# Patient Record
Sex: Female | Born: 1990
Health system: Southern US, Community
[De-identification: ages and names within clinical notes are randomized; demographics above are authoritative.]

## PROBLEM LIST (undated history)

## (undated) DIAGNOSIS — D649 Anemia, unspecified: Secondary | ICD-10-CM

## (undated) DIAGNOSIS — M2550 Pain in unspecified joint: Secondary | ICD-10-CM

## (undated) DIAGNOSIS — I471 Supraventricular tachycardia, unspecified: Secondary | ICD-10-CM

## (undated) DIAGNOSIS — E039 Hypothyroidism, unspecified: Secondary | ICD-10-CM

## (undated) DIAGNOSIS — I4719 Other supraventricular tachycardia: Secondary | ICD-10-CM

## (undated) DIAGNOSIS — E0590 Thyrotoxicosis, unspecified without thyrotoxic crisis or storm: Secondary | ICD-10-CM

## (undated) DIAGNOSIS — Z87442 Personal history of urinary calculi: Secondary | ICD-10-CM

## (undated) DIAGNOSIS — R000 Tachycardia, unspecified: Secondary | ICD-10-CM

## (undated) HISTORY — DX: Supraventricular tachycardia: I47.1

## (undated) HISTORY — DX: Supraventricular tachycardia, unspecified: I47.10

## (undated) HISTORY — DX: Pain in unspecified joint: M25.50

## (undated) HISTORY — DX: Other supraventricular tachycardia: I47.19

## (undated) HISTORY — PX: ABLATION: SHX5711

---
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
## 2012-09-05 ENCOUNTER — Other Ambulatory Visit (HOSPITAL_COMMUNITY): Payer: Self-pay | Admitting: Endocrinology

## 2012-09-05 DIAGNOSIS — E0590 Thyrotoxicosis, unspecified without thyrotoxic crisis or storm: Secondary | ICD-10-CM

## 2012-09-07 ENCOUNTER — Encounter (HOSPITAL_COMMUNITY): Payer: Managed Care, Other (non HMO)

## 2012-09-08 ENCOUNTER — Encounter (HOSPITAL_COMMUNITY): Payer: Managed Care, Other (non HMO)

## 2012-09-19 ENCOUNTER — Encounter (HOSPITAL_COMMUNITY): Admission: RE | Admit: 2012-09-19 | Payer: Managed Care, Other (non HMO) | Source: Ambulatory Visit

## 2012-09-20 ENCOUNTER — Encounter (HOSPITAL_COMMUNITY): Payer: Managed Care, Other (non HMO)

## 2012-09-22 ENCOUNTER — Encounter (HOSPITAL_COMMUNITY): Payer: Managed Care, Other (non HMO)

## 2012-09-23 ENCOUNTER — Encounter (HOSPITAL_COMMUNITY): Payer: Managed Care, Other (non HMO)

## 2012-09-28 ENCOUNTER — Encounter (HOSPITAL_COMMUNITY): Payer: Self-pay

## 2012-09-28 ENCOUNTER — Encounter (HOSPITAL_COMMUNITY)
Admission: RE | Admit: 2012-09-28 | Discharge: 2012-09-28 | Disposition: A | Discharge status: Home / Self Care | Payer: Managed Care, Other (non HMO) | Source: Ambulatory Visit | Attending: Endocrinology | Admitting: Endocrinology

## 2012-09-28 DIAGNOSIS — E0590 Thyrotoxicosis, unspecified without thyrotoxic crisis or storm: Secondary | ICD-10-CM | POA: Insufficient documentation

## 2012-09-28 MED ORDER — SODIUM IODIDE I 131 CAPSULE
7.0000 | Freq: Once | INTRAVENOUS | Status: AC | PRN
  Administered 2012-09-28: 7 via ORAL

## 2012-09-29 ENCOUNTER — Encounter (HOSPITAL_COMMUNITY)
Admission: RE | Admit: 2012-09-29 | Discharge: 2012-09-29 | Disposition: A | Discharge status: Home / Self Care | Payer: Managed Care, Other (non HMO) | Source: Ambulatory Visit | Attending: Endocrinology | Admitting: Endocrinology

## 2012-09-29 MED ORDER — SODIUM PERTECHNETATE TC 99M INJECTION
10.0000 | Freq: Once | INTRAVENOUS | Status: AC | PRN
  Administered 2012-09-29: 10 via INTRAVENOUS

## 2013-01-24 ENCOUNTER — Other Ambulatory Visit (HOSPITAL_COMMUNITY): Payer: Self-pay | Admitting: Endocrinology

## 2013-01-24 DIAGNOSIS — E0590 Thyrotoxicosis, unspecified without thyrotoxic crisis or storm: Secondary | ICD-10-CM

## 2013-01-27 ENCOUNTER — Encounter (HOSPITAL_COMMUNITY)
Admission: RE | Admit: 2013-01-27 | Discharge: 2013-01-27 | Disposition: A | Discharge status: Home / Self Care | Payer: Managed Care, Other (non HMO) | Source: Ambulatory Visit | Attending: Endocrinology | Admitting: Endocrinology

## 2013-01-27 ENCOUNTER — Encounter (HOSPITAL_COMMUNITY): Payer: Self-pay

## 2013-01-27 DIAGNOSIS — E0590 Thyrotoxicosis, unspecified without thyrotoxic crisis or storm: Secondary | ICD-10-CM | POA: Insufficient documentation

## 2013-01-27 LAB — HCG, SERUM, QUALITATIVE: Preg, Serum: NEGATIVE

## 2013-01-27 MED ORDER — SODIUM IODIDE I 131 CAPSULE
12.0000 | Freq: Once | INTRAVENOUS | Status: AC | PRN
  Administered 2013-01-27: 12 via ORAL

## 2013-01-29 ENCOUNTER — Emergency Department (HOSPITAL_COMMUNITY)
Admission: EM | Admit: 2013-01-29 | Discharge: 2013-01-29 | Disposition: A | Discharge status: Home / Self Care | Payer: Managed Care, Other (non HMO) | Attending: Emergency Medicine | Admitting: Emergency Medicine

## 2013-01-29 ENCOUNTER — Emergency Department (HOSPITAL_COMMUNITY): Payer: Managed Care, Other (non HMO)

## 2013-01-29 ENCOUNTER — Other Ambulatory Visit: Payer: Self-pay

## 2013-01-29 ENCOUNTER — Encounter (HOSPITAL_COMMUNITY): Payer: Self-pay | Admitting: *Deleted

## 2013-01-29 DIAGNOSIS — R5381 Other malaise: Secondary | ICD-10-CM | POA: Insufficient documentation

## 2013-01-29 DIAGNOSIS — R0789 Other chest pain: Secondary | ICD-10-CM | POA: Insufficient documentation

## 2013-01-29 DIAGNOSIS — E0590 Thyrotoxicosis, unspecified without thyrotoxic crisis or storm: Secondary | ICD-10-CM | POA: Insufficient documentation

## 2013-01-29 DIAGNOSIS — Z8679 Personal history of other diseases of the circulatory system: Secondary | ICD-10-CM | POA: Insufficient documentation

## 2013-01-29 DIAGNOSIS — R079 Chest pain, unspecified: Secondary | ICD-10-CM

## 2013-01-29 HISTORY — DX: Thyrotoxicosis, unspecified without thyrotoxic crisis or storm: E05.90

## 2013-01-29 HISTORY — DX: Tachycardia, unspecified: R00.0

## 2013-01-29 LAB — CBC WITH DIFFERENTIAL/PLATELET
Basophils Absolute: 0 10*3/uL (ref 0.0–0.1)
Eosinophils Relative: 0 % (ref 0–5)
Lymphocytes Relative: 16 % (ref 12–46)
MCV: 85.9 fL (ref 78.0–100.0)
Neutro Abs: 5.4 10*3/uL (ref 1.7–7.7)
Neutrophils Relative %: 81 % — ABNORMAL HIGH (ref 43–77)
Platelets: 296 10*3/uL (ref 150–400)
RBC: 4.47 MIL/uL (ref 3.87–5.11)
RDW: 12.3 % (ref 11.5–15.5)
WBC: 6.6 10*3/uL (ref 4.0–10.5)

## 2013-01-29 LAB — COMPREHENSIVE METABOLIC PANEL
ALT: 13 U/L (ref 0–35)
AST: 12 U/L (ref 0–37)
Alkaline Phosphatase: 70 U/L (ref 39–117)
CO2: 28 mEq/L (ref 19–32)
Calcium: 9.4 mg/dL (ref 8.4–10.5)
GFR calc non Af Amer: >90 mL/min (ref >90)
Potassium: 4.3 mEq/L (ref 3.5–5.1)
Sodium: 141 mEq/L (ref 135–145)

## 2013-01-29 NOTE — ED Notes (Signed)
Pt states that she had radiation performed for her thyroid on 01/27/2013 at [facility] radiology, started having left side chest pain that remains constant last night, pain is associated with light headedness, sob, states that she feels like her heart is beating fast when she lays down

## 2013-01-29 NOTE — ED Provider Notes (Signed)
History  This chart was scribed for Andrea Hutching, MD by Manuela Schwartz, ED scribe. This patient was seen in room APA07/APA07 and the patient's care was started at 1746.   CSN: [id number]  Arrival date & time 01/29/13  1746   First MD Initiated Contact with Patient 01/29/13 1809      Chief Complaint  Patient presents with  . Chest Pain   Patient is a 22 y.o. female presenting with chest pain. The history is provided by the patient. No language interpreter was used.  Chest Pain Pain location:  L chest Pain quality: pressure   Pain radiates to:  Does not radiate Pain radiates to the back: no   Pain severity:  Mild Onset quality:  Sudden Duration:  12 hours Timing:  Constant Progression:  Unchanged Chronicity:  New Context: at rest   Relieved by:  Nothing Worsened by:  Nothing tried Ineffective treatments:  None tried Associated symptoms: no fever, no nausea, no shortness of breath, not vomiting and no weakness    HPI Comments: Andrea Rangel is a 22 y.o. female who presents to the Emergency Department complaining of constant mild to moderate left sided chest heaviness/pain onset 930 last PM with associated fatigue. She had radiation performed for her thyroid on 01/27/2013 at [facility] radiology by x1 tablet of Iodine 131, treating for hyperthyroidism. She denies nausea, SOB. She had an ablation x2 years ago for SVT and currently on atenolol.     Past Medical History  Diagnosis Date  . Hyperthyroidism   . Tachycardia     Past Surgical History  Procedure Laterality Date  . Ablation      No family history on file.  History  Substance Use Topics  . Smoking status: Never Smoker   . Smokeless tobacco: Not on file  . Alcohol Use: No    OB History   Grav Para Term Preterm Abortions TAB SAB Ect Mult Living                  Review of Systems  Constitutional: Negative for fever and chills.  Respiratory: Negative for shortness of breath.   Cardiovascular: Positive  for chest pain.  Gastrointestinal: Negative for nausea and vomiting.  Neurological: Negative for weakness.  All other systems reviewed and are negative.   A complete 10 system review of systems was obtained and all systems are negative except as noted in the HPI and PMH.   Allergies  Review of patient's allergies indicates not on file.  Home Medications  No current outpatient prescriptions on file.  Triage Vitals: BP 113/61  Pulse 57  Temp(Src) 98 F (36.7 C)  Resp 16  Ht 5\' 4"  (1.626 m)  Wt 130 lb (58.968 kg)  BMI 22.3 kg/m2  SpO2 100%  LMP 01/27/2013  Physical Exam  Nursing note and vitals reviewed. Constitutional: She is oriented to person, place, and time. She appears well-developed and well-nourished.  HENT:  Head: Normocephalic and atraumatic.  Eyes: Conjunctivae and EOM are normal. Pupils are equal, round, and reactive to light.  Neck: Normal range of motion. Neck supple.  Cardiovascular: Normal rate, regular rhythm and normal heart sounds.   Pulmonary/Chest: Effort normal and breath sounds normal.  Abdominal: Soft. Bowel sounds are normal.  Musculoskeletal: Normal range of motion.  Neurological: She is alert and oriented to person, place, and time.  Skin: Skin is warm and dry.  Psychiatric: She has a normal mood and affect.    ED Course  Procedures (including  critical care time) DIAGNOSTIC STUDIES: Oxygen Saturation is 100% on room air, normal by my interpretation.    COORDINATION OF CARE: At 625 PM Discussed treatment plan with patient which includes CXR, blood work, D-dimer. Patient agrees.   Labs Reviewed  CBC WITH DIFFERENTIAL - Abnormal; Notable for the following:    Neutrophils Relative % 81 (*)    All other components within normal limits  COMPREHENSIVE METABOLIC PANEL - Abnormal; Notable for the following:    Glucose, Bld 117 (*)    Total Bilirubin 0.2 (*)    All other components within normal limits  TROPONIN I  D-DIMER, QUANTITATIVE   No  results found. Results for orders placed during the hospital encounter of 01/27/13  HCG, SERUM, QUALITATIVE      Result Value Range   Preg, Serum NEGATIVE  NEGATIVE   Nm Rai Therapy For Hyperthyroidism  01/27/2013   *RADIOLOGY REPORT*  Clinical Data: Hyperthyroidism  NUCLEAR MEDICINE RADIOACTIVE IODINE THERAPY FOR HYPERTHYROIDISM  Technique:  The risks and benefits of radioactive iodine therapy were discussed with the patient in detail. Alternative therapies were also mentioned. Radiation safety was discussed with the patient, including how to protect the general public from exposure. There were no barriers to communication.  Written consent was obtained.  The patient then received a capsule containing the radiopharmaceutical.  The patient will follow-up with the referring physician.  Radiopharmaceutical: CURIE I-131 SODIUM IODIDE I 131 CAPSULE  Comparison: 12 mCi I-131 sodium iodide.  IMPRESSION: Per oral administration of radiolabeled iodine for the treatment of hyperthyroidism.   Original Report Authenticated By: Signa Kell, M.D.   Dg Chest Port 1 View  01/29/2013   *RADIOLOGY REPORT*  Clinical Data: Chest pain.  PORTABLE CHEST - 1 VIEW  Comparison: None.  Findings: Heart size and pulmonary vascularity are normal and the lungs are clear.  No osseous abnormality.  IMPRESSION: Normal exam.   Original Report Authenticated By: Francene Boyers, M.D.     No diagnosis found.   Date: 01/29/2013  Rate: 57  Rhythm: sinus bradycardia  QRS Axis: normal  Intervals: normal  ST/T Wave abnormalities: normal  Conduction Disutrbances:right bundle branch block  Narrative Interpretation:   Old EKG Reviewed: changes noted   MDM   Patient is low risk for acute coronary syndrome or pulmonary embolism. Screening tests including EKG, chest x-ray, troponin, d-dimer all negative.  She is in no acute distress. Vital signs are normal    I personally performed the services described in this  documentation, which was scribed in my presence. The recorded information has been reviewed and is accurate.          Andrea Hutching, MD 01/29/13 [phone]

## 2013-05-15 ENCOUNTER — Other Ambulatory Visit: Payer: Self-pay

## 2013-05-15 ENCOUNTER — Emergency Department (HOSPITAL_COMMUNITY)
Admission: EM | Admit: 2013-05-15 | Discharge: 2013-05-15 | Disposition: A | Discharge status: Home / Self Care | Payer: BC Managed Care – PPO | Attending: Emergency Medicine | Admitting: Emergency Medicine

## 2013-05-15 ENCOUNTER — Encounter (HOSPITAL_COMMUNITY): Payer: Self-pay

## 2013-05-15 ENCOUNTER — Emergency Department (HOSPITAL_COMMUNITY): Payer: BC Managed Care – PPO

## 2013-05-15 DIAGNOSIS — R110 Nausea: Secondary | ICD-10-CM | POA: Insufficient documentation

## 2013-05-15 DIAGNOSIS — E0590 Thyrotoxicosis, unspecified without thyrotoxic crisis or storm: Secondary | ICD-10-CM | POA: Insufficient documentation

## 2013-05-15 DIAGNOSIS — R0602 Shortness of breath: Secondary | ICD-10-CM | POA: Insufficient documentation

## 2013-05-15 DIAGNOSIS — Z79899 Other long term (current) drug therapy: Secondary | ICD-10-CM | POA: Insufficient documentation

## 2013-05-15 DIAGNOSIS — R0789 Other chest pain: Secondary | ICD-10-CM | POA: Insufficient documentation

## 2013-05-15 DIAGNOSIS — R079 Chest pain, unspecified: Secondary | ICD-10-CM

## 2013-05-15 LAB — BASIC METABOLIC PANEL
BUN: 15 mg/dL (ref 6–23)
Chloride: 102 mEq/L (ref 96–112)
Creatinine, Ser: 0.77 mg/dL (ref 0.50–1.10)
GFR calc Af Amer: >90 mL/min (ref >90)
GFR calc non Af Amer: >90 mL/min (ref >90)
Glucose, Bld: 101 mg/dL — ABNORMAL HIGH (ref 70–99)
Potassium: 4.6 mEq/L (ref 3.5–5.1)

## 2013-05-15 LAB — CBC WITH DIFFERENTIAL/PLATELET
Basophils Absolute: 0 10*3/uL (ref 0.0–0.1)
Basophils Relative: 0 % (ref 0–1)
Eosinophils Absolute: 0 10*3/uL (ref 0.0–0.7)
Eosinophils Relative: 0 % (ref 0–5)
HCT: 42.1 % (ref 36.0–46.0)
Hemoglobin: 14.4 g/dL (ref 12.0–15.0)
Lymphs Abs: 2.3 10*3/uL (ref 0.7–4.0)
MCH: 29.3 pg (ref 26.0–34.0)
MCHC: 34.2 g/dL (ref 30.0–36.0)
Monocytes Absolute: 0.9 10*3/uL (ref 0.1–1.0)
Monocytes Relative: 7 % (ref 3–12)
Neutro Abs: 9.4 10*3/uL — ABNORMAL HIGH (ref 1.7–7.7)
Neutrophils Relative %: 74 % (ref 43–77)
RDW: 13.7 % (ref 11.5–15.5)
WBC: 12.7 10*3/uL — ABNORMAL HIGH (ref 4.0–10.5)

## 2013-05-15 LAB — TROPONIN I: Troponin I: <0.3 ng/mL (ref <0.30)

## 2013-05-15 MED ORDER — HYDROCODONE-ACETAMINOPHEN 5-325 MG PO TABS: 1.0000 | ORAL_TABLET | Freq: Four times a day (QID) | ORAL | Status: DC | PRN

## 2013-05-15 MED ORDER — IBUPROFEN 600 MG PO TABS: 600.0000 mg | ORAL_TABLET | Freq: Four times a day (QID) | ORAL | Status: DC | PRN

## 2013-05-15 MED ORDER — HYDROCODONE-ACETAMINOPHEN 5-325 MG PO TABS
1.0000 | ORAL_TABLET | Freq: Once | ORAL | Status: AC
  Administered 2013-05-15: 1 via ORAL
  Filled 2013-05-15: qty 1

## 2013-05-15 NOTE — ED Notes (Signed)
Pt states she started feeling some pressur/tightness in her chest last night, states it "feels like I can't take a good breath"  Pt states she took a half of her 25 mg atenolol.

## 2013-05-15 NOTE — ED Notes (Signed)
Patient given discharge instruction, verbalized understand. Patient ambulatory out of the department.  

## 2013-05-15 NOTE — ED Provider Notes (Signed)
CSN: [id number]     Arrival date & time 05/15/13  0155 History   First MD Initiated Contact with Patient 05/15/13 0226     Chief Complaint  Patient presents with  . Chest Pain   (Consider location/radiation/quality/duration/timing/severity/associated sxs/prior Treatment) Patient is a 22 y.o. female presenting with chest pain. The history is provided by the patient.  Chest Pain Associated symptoms: nausea and shortness of breath   Associated symptoms: no abdominal pain, no back pain, no fever, no headache and not vomiting    patient with onset of left substernal chest pain waxes and wanes but it's been constant since 6 PM on Sunday. That would be yesterday. States the pain is 9/10 associated with some mild shortness of breath and nausea no vomiting worse with breathing. No injury. No fever. No history of similar pain. Pain is described as sharp ache. Nonradiating.  Past Medical History  Diagnosis Date  . Hyperthyroidism   . Tachycardia    Past Surgical History  Procedure Laterality Date  . Ablation     No family history on file. History  Substance Use Topics  . Smoking status: Never Smoker   . Smokeless tobacco: Not on file  . Alcohol Use: No   OB History   Grav Para Term Preterm Abortions TAB SAB Ect Mult Living                 Review of Systems  Constitutional: Negative for fever.  HENT: Negative for congestion.   Eyes: Negative for redness.  Respiratory: Positive for shortness of breath.   Cardiovascular: Positive for chest pain.  Gastrointestinal: Positive for nausea. Negative for vomiting and abdominal pain.  Genitourinary: Negative for dysuria.  Musculoskeletal: Negative for myalgias and back pain.  Skin: Negative for rash.  Neurological: Negative for headaches.  Hematological: Does not bruise/bleed easily.  Psychiatric/Behavioral: Negative for confusion.    Allergies  Review of patient's allergies indicates no known allergies.  Home Medications   Current  Outpatient Rx  Name  Route  Sig  Dispense  Refill  . atenolol (TENORMIN) 25 MG tablet   Oral   Take 25 mg by mouth daily.         Marland Kitchen levothyroxine (SYNTHROID, LEVOTHROID) 100 MCG tablet   Oral   Take 100 mcg by mouth daily before breakfast.         . HYDROcodone-acetaminophen (NORCO/VICODIN) 5-325 MG per tablet   Oral   Take 1-2 tablets by mouth every 6 (six) hours as needed for pain.   10 tablet   0   . ibuprofen (ADVIL,MOTRIN) 600 MG tablet   Oral   Take 1 tablet (600 mg total) by mouth every 6 (six) hours as needed for pain.   30 tablet   0   . predniSONE (DELTASONE) 10 MG tablet   Oral   Take 30 mg by mouth daily.          BP 135/85  Pulse 72  Temp(Src) 98.1 F (36.7 C) (Oral)  Resp 20  Ht 5\' 4"  (1.626 m)  Wt 130 lb (58.968 kg)  BMI 22.3 kg/m2  SpO2 98%  LMP 05/01/2013 Physical Exam  Nursing note and vitals reviewed. Constitutional: She is oriented to person, place, and time. She appears well-developed and well-nourished. No distress.  HENT:  Head: Normocephalic and atraumatic.  Mouth/Throat: Oropharynx is clear and moist.  Eyes: Conjunctivae and EOM are normal. Pupils are equal, round, and reactive to light.  Neck: Normal range of motion.  Cardiovascular:  Normal rate, regular rhythm and normal heart sounds.   No murmur heard. Pulmonary/Chest: Effort normal and breath sounds normal. No respiratory distress.  Abdominal: Soft. Bowel sounds are normal. There is no tenderness.  Musculoskeletal: She exhibits no edema.  Neurological: She is alert and oriented to person, place, and time. No cranial nerve deficit. She exhibits normal muscle tone. Coordination normal.  Skin: Skin is warm. No rash noted.    ED Course  Procedures (including critical care time) Labs Review Labs Reviewed  CBC WITH DIFFERENTIAL - Abnormal; Notable for the following:    WBC 12.7 (*)    Neutro Abs 9.4 (*)    All other components within normal limits  BASIC METABOLIC PANEL -  Abnormal; Notable for the following:    Glucose, Bld 101 (*)    All other components within normal limits  TROPONIN I  D-DIMER, QUANTITATIVE   Results for orders placed during the hospital encounter of 05/15/13  TROPONIN I      Result Value Range   Troponin I <0.30  <0.30 ng/mL  D-DIMER, QUANTITATIVE      Result Value Range   D-Dimer, Quant <0.27  0.00 - 0.48 ug/mL-FEU  CBC WITH DIFFERENTIAL      Result Value Range   WBC 12.7 (*) 4.0 - 10.5 K/uL   RBC 4.91  3.87 - 5.11 MIL/uL   Hemoglobin 14.4  12.0 - 15.0 g/dL   HCT 45.4  09.8 - 11.9 %   MCV 85.7  78.0 - 100.0 fL   MCH 29.3  26.0 - 34.0 pg   MCHC 34.2  30.0 - 36.0 g/dL   RDW 14.7  82.9 - 56.2 %   Platelets 307  150 - 400 K/uL   Neutrophils Relative % 74  43 - 77 %   Neutro Abs 9.4 (*) 1.7 - 7.7 K/uL   Lymphocytes Relative 18  12 - 46 %   Lymphs Abs 2.3  0.7 - 4.0 K/uL   Monocytes Relative 7  3 - 12 %   Monocytes Absolute 0.9  0.1 - 1.0 K/uL   Eosinophils Relative 0  0 - 5 %   Eosinophils Absolute 0.0  0.0 - 0.7 K/uL   Basophils Relative 0  0 - 1 %   Basophils Absolute 0.0  0.0 - 0.1 K/uL  BASIC METABOLIC PANEL      Result Value Range   Sodium 140  135 - 145 mEq/L   Potassium 4.6  3.5 - 5.1 mEq/L   Chloride 102  96 - 112 mEq/L   CO2 27  19 - 32 mEq/L   Glucose, Bld 101 (*) 70 - 99 mg/dL   BUN 15  6 - 23 mg/dL   Creatinine, Ser 1.30  0.50 - 1.10 mg/dL   Calcium 86.5  8.4 - 78.4 mg/dL   GFR calc non Af Amer >90  >90 mL/min   GFR calc Af Amer >90  >90 mL/min    Date: 05/15/2013  Rate: 70  Rhythm: normal sinus rhythm  QRS Axis: normal  Intervals: normal  ST/T Wave abnormalities: normal  Conduction Disutrbances:none  Narrative Interpretation:   Old EKG Reviewed: unchanged No significant change in EKG compared to 01/29/2013.  Imaging Review Dg Chest 2 View  05/15/2013   *RADIOLOGY REPORT*  Clinical Data: Chest pain and shortness of breath.  CHEST - 2 VIEW  Comparison: Chest radiograph performed 01/29/2013   Findings: The lungs are well-aerated and clear.  There is no evidence of focal opacification, pleural  effusion or pneumothorax.  The heart is normal in size; the mediastinal contour is within normal limits.  No acute osseous abnormalities are seen.  IMPRESSION: No acute cardiopulmonary process seen.   Original Report Authenticated By: Tonia Ghent, M.D.    MDM   1. Chest pain    Workup for the chest pain without any significant findings. EKG without acute changes. Troponin negative d-dimer negative not consistent with pulmonary embolism. Chest x-ray negative for pneumonia pulmonary edema or pneumothorax. Basic labs without any significant abnormalities. Mild leukocytosis no anemia no electrolyte disturbances we'll treat as if it's chest wall pain with anti-inflammatory and pain medicine. Patient has primary care Dr. for followup.    Shelda Jakes, MD 05/15/13 [phone]

## 2013-07-01 ENCOUNTER — Encounter: Payer: Self-pay | Admitting: Cardiology

## 2013-07-01 ENCOUNTER — Encounter: Payer: Self-pay | Admitting: *Deleted

## 2013-07-01 DIAGNOSIS — M2550 Pain in unspecified joint: Secondary | ICD-10-CM | POA: Insufficient documentation

## 2013-07-01 DIAGNOSIS — E0590 Thyrotoxicosis, unspecified without thyrotoxic crisis or storm: Secondary | ICD-10-CM | POA: Insufficient documentation

## 2013-07-01 DIAGNOSIS — I471 Supraventricular tachycardia: Secondary | ICD-10-CM | POA: Insufficient documentation

## 2013-07-01 DIAGNOSIS — R000 Tachycardia, unspecified: Secondary | ICD-10-CM | POA: Insufficient documentation

## 2013-07-03 ENCOUNTER — Ambulatory Visit: Payer: Managed Care, Other (non HMO) | Admitting: Cardiology

## 2013-07-07 ENCOUNTER — Encounter: Payer: Self-pay | Admitting: Cardiology

## 2013-07-25 ENCOUNTER — Telehealth: Payer: Self-pay | Admitting: Cardiology

## 2013-07-25 NOTE — Telephone Encounter (Signed)
Walk in pt Form " Dept Of Transportation" papers dropped Off gave to [employer].

## 2013-08-15 ENCOUNTER — Telehealth: Payer: Self-pay | Admitting: Cardiology

## 2013-08-15 NOTE — Telephone Encounter (Signed)
DMV paperwork ready For pick up LMOVM For Pt

## 2013-08-18 ENCOUNTER — Telehealth: Payer: Self-pay | Admitting: Cardiology

## 2013-08-18 NOTE — Telephone Encounter (Signed)
DMV paper Picked Up

## 2013-09-01 ENCOUNTER — Telehealth: Payer: Self-pay | Admitting: Cardiology

## 2013-09-01 NOTE — Telephone Encounter (Signed)
Walk in pt Form " Dept Of Trans" papers [facility]pped [facility]

## 2013-09-06 ENCOUNTER — Telehealth: Payer: Self-pay | Admitting: Cardiology

## 2013-09-06 NOTE — Telephone Encounter (Signed)
Clearance

## 2013-09-07 ENCOUNTER — Telehealth: Payer: Self-pay | Admitting: Cardiology

## 2013-09-07 NOTE — Telephone Encounter (Signed)
Left voicemail for patient to call the office 

## 2013-09-07 NOTE — Telephone Encounter (Signed)
New problem   Pt need to know if her DMV form is ready to pick up. Please call pt.

## 2013-09-13 ENCOUNTER — Telehealth: Payer: Self-pay | Admitting: Cardiology

## 2013-09-13 NOTE — Telephone Encounter (Signed)
Pt Picked Up DMV paper.

## 2013-09-14 NOTE — Telephone Encounter (Signed)
New message   Patient drop off DMV paperwork today . Need to be filled out today . Spoke with Doylene BodeGina lurz who put form on Dr. Anne FuSkains desk - forward phone call to her speak with patient.

## 2013-09-15 ENCOUNTER — Telehealth: Payer: Self-pay | Admitting: Cardiology

## 2013-09-15 ENCOUNTER — Encounter: Payer: Self-pay | Admitting: Cardiology

## 2013-09-15 NOTE — Telephone Encounter (Signed)
Paper work faxed to patient, patient aware that Dr. Anne FuSkains portion of the paperwork has been filled out and she need to see someone certified to fill out remaining portions of paperwork ,[facility] [address]

## 2013-09-15 NOTE — Telephone Encounter (Signed)
Call regarding paperwork

## 2013-09-15 NOTE — Telephone Encounter (Signed)
New problem    Pt stated she need a letter to give [facility] stating [facility] and why, that she wanted filled out. Please call pt concerning this matter.

## 2013-09-15 NOTE — Telephone Encounter (Signed)
[facility], copy at front desk

## 2013-09-15 NOTE — Telephone Encounter (Signed)
Spoke with patient advised we would be a letter prepared and [facility]ed to [facility].

## 2013-09-20 ENCOUNTER — Ambulatory Visit: Payer: Managed Care, Other (non HMO) | Admitting: Cardiology

## 2013-11-26 IMAGING — NM NM RAI THERAPY FOR HYPERTHYROIDISM
1 series · 1 of 1 positions shown · non-contrast
Comparison: 12 mCi D-CBC sodium iodide.

CLINICAL DATA: Hyperthyroidism

NUCLEAR MEDICINE RADIOACTIVE IODINE THERAPY FOR HYPERTHYROIDISM
TECHNIQUE: The risks and benefits of radioactive iodine therapy
were discussed with the patient in detail. Alternative therapies
were also mentioned. Radiation safety was discussed with the
patient, including how to protect the general public from exposure.
There were no barriers to communication.  Written consent was
obtained.  The patient then received a capsule containing the
radiopharmaceutical.  The patient will follow-up with the referring
physician.
Radiopharmaceutical: [id number] ERDJEP D-CBC SODIUM IODIDE I 131
CAPSULE

[st static · 2.33mm/px · 1 of 1 slices shown]
[im 1/1]
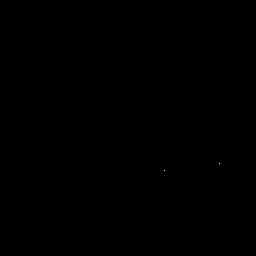

[1 of 1 positions shown; findings below may reference images not displayed]

IMPRESSION: Per oral administration of radiolabeled iodine for the treatment of
hyperthyroidism.

## 2014-03-02 ENCOUNTER — Encounter: Payer: Self-pay | Admitting: Podiatry

## 2014-03-02 ENCOUNTER — Ambulatory Visit: Payer: BC Managed Care – PPO | Admitting: Podiatry

## 2014-03-02 VITALS — BP 101/64 | HR 61 | Resp 16 | Ht 62.0 in | Wt 140.0 lb

## 2014-03-02 DIAGNOSIS — L600 Ingrowing nail: Secondary | ICD-10-CM

## 2014-03-02 NOTE — Patient Instructions (Signed)
Betadine Soak Instructions  Purchase an 8 oz. bottle of BETADINE solution (Povidone)  THE DAY AFTER THE PROCEDURE  Place 1 tablespoon of betadine solution in a quart of warm tap water.  Submerge your foot or feet with outer bandage intact for the initial soak; this will allow the bandage to become moist and wet for easy lift off.  Once you remove your bandage, continue to soak in the solution for 20 minutes.  This soak should be done twice a day.  Next, remove your foot or feet from solution, blot dry the affected area and cover.  You may use a band aid large enough to cover the area or use gauze and tape.  Apply other medications to the area as directed by the doctor such as cortisporin otic solution (ear drops) or neosporin.  IF YOUR SKIN BECOMES IRRITATED WHILE USING THESE INSTRUCTIONS, IT IS OKAY TO SWITCH TO EPSOM SALTS AND WATER OR [place] VINEGAR AND WATER.   Long Term Care Instructions-Post Nail Surgery  You have had your ingrown toenail and root treated with a chemical.  This chemical causes a burn that will drain and ooze like a blister.  This can drain for 6-8 weeks or longer.  It is important to keep this area clean, covered, and follow the soaking instructions dispensed at the time of your surgery.  This area will eventually dry and form a scab.  Once the scab forms you no longer need to soak or apply a dressing.  If at any time you experience an increase in pain, redness, swelling, or drainage, you should contact the office as soon as possible.  

## 2014-03-02 NOTE — Progress Notes (Signed)
   Subjective:    Patient ID: Andrea Rangel, female    DOB: 12/06/1990, 23 y.o.   MRN: [id number]  HPI Comments: i have an ingrown toenail on my big toenail rt foot. Ive had it for 2 months. Its getting worse. It hurts to stand, walk and wear shoes. i had a pedicure and they said they got it out but they didn't. i put neosporin on my toe.     Review of Systems  Genitourinary: Positive for frequency.  All other systems reviewed and are negative.      Objective:   Physical Exam        Assessment & Plan:

## 2014-03-03 NOTE — Progress Notes (Signed)
Subjective:     [facility]   DOB: 11-17-1990, 23 y.o.   MRN: [id number]  HPI patient presents with caregiver stating that she has a chronic ingrown toenail right big toe medial border that she cannot cut herself and pedicure was unable to help   Review of Systems  All other systems reviewed and are negative.      Objective:   Physical Exam  Nursing note and vitals reviewed. Constitutional: She is oriented to person, place, and time.  Cardiovascular: Intact distal pulses.   Musculoskeletal: Normal range of motion.  Neurological: She is oriented to person, place, and time.  Skin: Skin is warm.   neurovascular status intact with muscle strength adequate and range of motion subtalar and midtarsal joint within normal limits. Patient is found to have well-perfused digits and is found to have incurvated right hallux medial border that is very painful when pressed     Assessment:     Ingrown toenail deformity right hallux medial border with pain    Plan:     Reviewed condition and explained procedure and risk. They want surgery and today I infiltrated 60 mg Xylocaine Marcaine mixture remove the medial border exposed matrix and applied phenol 3 applications 30 seconds and applied sterile dressing and begin soaks. Reappoint to recheck

## 2015-08-11 NOTE — L&D Delivery Note (Signed)
PROCEDURE DATE: 04/01/2016  PREOPERATIVE DIAGNOSIS: Intrauterine pregnancy at 39.5 wga, Indication: arrest of dilation and nrFHT  POSTOPERATIVE DIAGNOSIS:The same  PROCEDURE: Low TransverseCesarean Section  SURGEON: Dr. Belva AgeeElise Sena Clouatre  INDICATIONS:This is a 25yo G1 at 0 wga requiring cesarean section secondary to arrest of dilation in setting of nrFHT.  Patient presented in latent labor. Progressed to 5.5cm but then remained there for 5 hours despite adequate contractions. FHT then notable for recurrent late decels with periods of minimal var. Decision made to proceed with pLTCS.The risks of cesarean section discussed with the patient included but were not limited to: bleeding which may require transfusion or reoperation; infection which may require antibiotics; injury to bowel, bladder, ureters or other surrounding organs; injury to the fetus; need for additional procedures including hysterectomy in the event of a life-threatening hemorrhage; placental abnormalities wth subsequent pregnancies, incisional problems, thromboembolic phenomenon and other postoperative/anesthesia complications. The patient agreed with the proposed plan, giving informed consent for the procedure.   FINDINGS: Viable maleinfant in vertex presentation,APGARs 4/9, Weight pending, Amniotic fluid with meconium, Intact placenta, three vessel cord. Grossly normal uterus, ovaries and fallopian tubes.  .  ANESTHESIA: Epidural ESTIMATED BLOOD LOSS: 800cc SPECIMENS: Placenta for pathology COMPLICATIONS: None immediate   PROCEDURE IN DETAIL: The patient received intravenous antibiotics (2g Ancef) and had sequential compression devices applied to her lower extremities while in the preoperative area. Shewasthen taken to the operating roomwhere epidural anesthesiawas dosed up to surgical level andwas found to be adequate. She was then placed in a dorsal supine position with a leftward tilt,and prepped  and draped in a sterile manner.A foley catheter was placed into her bladder and attached to constant gravity. After an adequate timeout was performed, aPfannenstiel skin incision was made with scalpel and carried through to the underlying layer of fascia. The fascia was incised in the midline and this incision was extended bilaterally using the Mayo scissors. Kocher clamps were applied to the superior aspect of the fascial incision and the underlying rectus muscles were dissected off bluntly. A similar process was carried out on the inferior aspect of the facial incision. The rectus muscles were separated in the midline bluntly and the peritoneum was entered bluntly. Alexis retractor placed. A bladder flap was created sharply and developed bluntly.Atransverse hysterotomy was made with a scalpel and extended bilaterally bluntly. The bladder blade was then removed. The infant was successfully delivered, and cord was clamped and cut and infant was handed over to awaiting neonatology team. Uterine massage was then administered and the placenta delivered intact with three-vessel cord. Cord gases were taken. Cord blood donation collected.The uterus was cleared of clot and debris. The hysterotomy was closed with 0 vicryl.A second imbricating suture of 0-vicryl was used to reinforce the incision and aid in hemostasis. Uterus noted to be atonic. Pitocin doubled and methergine given x 1 with resolution of atony. Alexis retractor removed. Rectus muscles reapproximated with one figure of eight. The fascia was closed with 0-Vicryl in a running fashion with good restoration of anatomy. The subcutaneus tissue was irrigated and was reapproximated using plain gut stitches. The skin was closed with 4-0 Vicryl in a subcuticular fashion.  Final EBL was 800 (all surgical site and was hemostatic at end of procedure) without any further bleeding on exam.   Pt tolerated the procedure well. All sponge/lap/needle counts  were correct X 2. Pt taken to recovery room in stable condition.  It's a boy, "Andrea Rangel"!! Dad's name is Andrea Rangel.   Andrea AgeeElise Darcella Shiffman MD

## 2015-08-11 NOTE — L&D Delivery Note (Signed)
Cesarean Section Procedure Note  PROCEDURE DATE: 04/01/2016  PREOPERATIVE DIAGNOSIS: Intrauterine pregnancy at 39.5 wga, Indication: arrest of dilation and nrFHT  POSTOPERATIVE DIAGNOSIS:The same  PROCEDURE: Low TransverseCesarean Section  SURGEON: Dr. Belva Agee  INDICATIONS:This is a 25yo G1 at 0 wga requiring cesarean section secondary to arrest of dilation in setting of nrFHT.  Patient presented in latent labor. Progressed to 5.5cm but then remained there for 5 hours despite adequate contractions. FHT then notable for recurrent late decels with periods of minimal var. Decision made to proceed with pLTCS.The risks of cesarean section discussed with the patient included but were not limited to: bleeding which may require transfusion or reoperation; infection which may require antibiotics; injury to bowel, bladder, ureters or other surrounding organs; injury to the fetus; need for additional procedures including hysterectomy in the event of a life-threatening hemorrhage; placental abnormalities wth subsequent pregnancies, incisional problems, thromboembolic phenomenon and other postoperative/anesthesia complications. The patient agreed with the proposed plan, giving informed consent for the procedure.   FINDINGS: Viable maleinfant in vertex presentation,APGARs 4/9, Weight pending, Amniotic fluid with meconium, Intact placenta, three vessel cord. Grossly normal uterus, ovaries and fallopian tubes.  .  ANESTHESIA: Epidural ESTIMATED BLOOD LOSS: 800cc SPECIMENS: Placenta for pathology COMPLICATIONS: None immediate   PROCEDURE IN DETAIL: The patient received intravenous antibiotics (2g Ancef) and had sequential compression devices applied to her lower extremities while in the preoperative area. Shewasthen taken to the operating roomwhere epidural anesthesiawas dosed up to surgical level andwas found to be adequate. She was then placed in a dorsal supine position  with a leftward tilt,and prepped and draped in a sterile manner.A foley catheter was placed into her bladder and attached to constant gravity. After an adequate timeout was performed, aPfannenstiel skin incision was made with scalpel and carried through to the underlying layer of fascia. The fascia was incised in the midline and this incision was extended bilaterally using the Mayo scissors. Kocher clamps were applied to the superior aspect of the fascial incision and the underlying rectus muscles were dissected off bluntly. A similar process was carried out on the inferior aspect of the facial incision. The rectus muscles were separated in the midline bluntly and the peritoneum was entered bluntly. Alexis retractor placed. A bladder flap was created sharply and developed bluntly.Atransverse hysterotomy was made with a scalpel and extended bilaterally bluntly. The bladder blade was then removed. The infant was successfully delivered, and cord was clamped and cut and infant was handed over to awaiting neonatology team. Uterine massage was then administered and the placenta delivered intact with three-vessel cord. Cord gases were taken. Cord blood donation collected.The uterus was cleared of clot and debris. The hysterotomy was closed with 0 vicryl.A second imbricating suture of 0-vicryl was used to reinforce the incision and aid in hemostasis. Uterus noted to be atonic. Pitocin doubled and methergine given x 1 with resolution of atony. Alexis retractor removed. Rectus muscles reapproximated with one figure of eight. The fascia was closed with 0-Vicryl in a running fashion with good restoration of anatomy. The subcutaneus tissue was irrigated and was reapproximated using plain gut stitches. The skin was closed with 4-0 Vicryl in a subcuticular fashion.  Final EBL was 800 (all surgical site and was hemostatic at end of procedure) without any further bleeding on exam.   Pt tolerated the procedure  well. All sponge/lap/needle counts were correct X 2. Pt taken to recovery room in stable condition.  It's a boy, "Thomas Hoff"!! Dad's name is Amalia Hailey.  Belva AgeeElise Welma Mccombs MD

## 2015-09-04 LAB — OB RESULTS CONSOLE RUBELLA ANTIBODY, IGM: Rubella: NON-IMMUNE/NOT IMMUNE

## 2015-09-04 LAB — OB RESULTS CONSOLE HIV ANTIBODY (ROUTINE TESTING): HIV: NONREACTIVE

## 2015-09-04 LAB — OB RESULTS CONSOLE RPR: RPR: NONREACTIVE

## 2015-09-04 LAB — OB RESULTS CONSOLE HEPATITIS B SURFACE ANTIGEN: HEP B S AG: NEGATIVE

## 2015-09-16 ENCOUNTER — Ambulatory Visit: Payer: Self-pay | Admitting: Cardiovascular Disease

## 2015-09-16 ENCOUNTER — Telehealth: Payer: Self-pay | Admitting: Cardiovascular Disease

## 2015-09-16 NOTE — Telephone Encounter (Signed)
Received records from [facility] for appointment on 10/08/15 with Dr Duke Salvia.  Records given to [facility] (medical records for Dr Leonides Sake schedule on 10/08/15. lp

## 2015-10-08 ENCOUNTER — Encounter: Payer: Self-pay | Admitting: Cardiovascular Disease

## 2015-10-08 ENCOUNTER — Ambulatory Visit (INDEPENDENT_AMBULATORY_CARE_PROVIDER_SITE_OTHER): Payer: 59 | Admitting: Cardiovascular Disease

## 2015-10-08 VITALS — BP 114/72 | HR 90 | Ht 62.0 in | Wt 149.3 lb

## 2015-10-08 DIAGNOSIS — I471 Supraventricular tachycardia: Secondary | ICD-10-CM | POA: Diagnosis not present

## 2015-10-08 DIAGNOSIS — I4719 Other supraventricular tachycardia: Secondary | ICD-10-CM | POA: Insufficient documentation

## 2015-10-08 NOTE — Patient Instructions (Signed)
Dr  recommends that you follow-up with her as needed. 

## 2015-10-08 NOTE — Progress Notes (Signed)
.   Cardiology Office Note   Date:  10/08/2015   ID:  [facility], DOB 12/20/1990, MRN [id number]  PCP:  Kirk Ruths, MD  Cardiologist:   Madilyn Hook, MD   Chief Complaint  Patient presents with  . New Evaluation    Hx of cardiac ablation--Referred by Dr. Adkins//pt states no Sx.      History of Present Illness: Andrea Rangel is a 25 y.o. [redacted] weeks pregnant female with hypothyroidism and AVNRT s/p ablation who presents to establish care.  [name] underwent ablation for AVNRT in 2012.  Prior to this she had episodes of AVNRT that required adenosine to terminate.  After the ablation she continued to have some tachyarrhythmias.  However, she was found to have hyperthyroidism and had her thyroid removed.  Since that time she denies any recurrent palpitations.  [name] denies chest pain, shortness of breath, lower extremity edema, orthopnea or PND.  She has some mild nausea with the pregnancy but is otherwise well. She reported her history to her OB/GYN, Dr. Zelphia Cairo, and Ms. Citron was unsure of whether she should have any cardiac restrictions on her pregnancy.  Therefore, she requested referral to cardiology for evaluation.  [name] does not get much formal exercise.  She tries to walk when able and denies any chest pain or shortness of breath.  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   Past Medical History  Diagnosis Date  . Hyperthyroidism   . Tachycardia   . Pain in joint   . SVT (supraventricular tachycardia) (HCC)     AV nodal re-entry tachycardia (Dr. Meredeth Ide) s/p ablation  . AVNRT (AV nodal re-entry tachycardia) (HCC)     s/p successful ablation in 2012    Past Surgical History  Procedure Laterality Date  . Ablation       Current Outpatient Prescriptions  Medication Sig Dispense Refill  . DICLEGIS 10-10 MG TBEC Take 2 tablets by mouth at bedtime.    Marland Kitchen levothyroxine  (SYNTHROID, LEVOTHROID) 112 MCG tablet Take 1 tablet by mouth every morning.    . Prenatal Vit-Fe Fumarate-FA (PRENATAL PO) Take 2 Doses by mouth daily.     No current facility-administered medications for this visit.    Allergies:   Review of patient's allergies indicates no known allergies.    Social History:  The patient  reports that she has never smoked. She does not have any smokeless tobacco history on file. She reports that she does not drink alcohol or use illicit drugs.   Family History:  The patient's family history includes Breast cancer in her maternal grandmother; Heart disease in her maternal grandfather.    ROS:  Please see the history of present illness.   Otherwise, review of systems are positive for none.   All other systems are reviewed and negative.    PHYSICAL EXAM: VS:  BP 114/72 mmHg  Pulse 90  Ht  (1.575 m)  Wt 67.722 kg (149 lb 4.8 oz)  BMI 27.30 kg/m2 , BMI Body mass index is 27.3 kg/(m^2). GENERAL:  Well appearing HEENT:  Pupils equal round and reactive, fundi not visualized, oral mucosa unremarkable NECK:  No jugular venous distention, waveform within normal limits, carotid upstroke brisk and symmetric, no bruits, no thyromegaly LYMPHATICS:  No cervical adenopathy LUNGS:  Clear to auscultation bilaterally HEART:  RRR.  PMI not displaced or sustained,S1 and S2 within normal limits, no S3,  no S4, no clicks, no rubs, no murmurs ABD:  Flat, positive bowel sounds normal in frequency in pitch, no bruits, no rebound, no guarding, no midline pulsatile mass, no hepatomegaly, no splenomegaly EXT:  2 plus pulses throughout, no edema, no cyanosis no clubbing SKIN:  No rashes no nodules NEURO:  Cranial nerves II through XII grossly intact, motor grossly intact throughout PSYCH:  Cognitively intact, oriented to person place and time    EKG:  EKG is ordered today. The ekg ordered today demonstrates sinus rhythm rate 90 bpm.     Recent Labs: No results  found for requested labs within last 365 days.    Lipid Panel No results found for: CHOL, TRIG, HDL, CHOLHDL, VLDL, LDLCALC, LDLDIRECT    Wt Readings from Last 3 Encounters:  10/08/15 67.722 kg (149 lb 4.8 oz)  03/02/14 63.504 kg (140 lb)  05/15/13 58.968 kg (130 lb)      ASSESSMENT AND PLAN:  # AVNRT s/p ablation: [name] had a successful ablation in 2012 without recurrent arrhythmia.  Fortunately, this particular arrhythmia has a particularly high cure rate after ablation.  I don't suspect that she will have any recurrent arrhythmia.  She has no evidence of heart failure or any other cardiac disease by history or on exam.  She does not need any cardiology follow up unless she becomes symptomatic.  # Pregnancy: There are no cardiac concerns for her pregnancy. She should be safe to have a vaginal delivery if otherwise appropriate per her OB/GYN.    Current medicines are reviewed at length with the patient today.  The patient does not have concerns regarding medicines.  The following changes have been made:  no change  Labs/ tests ordered today include:  No orders of the defined types were placed in this encounter.     Disposition:   FU with Caidyn Henricksen C. Duke Salvia, MD, [facility] as needed.     This note was written with the assistance of speech recognition software.  Please excuse any transcriptional errors.  Signed, Diera Wirkkala C. Duke Salvia, MD, [facility]  10/08/2015 3:05 PM    [facility]

## 2015-10-09 ENCOUNTER — Encounter: Payer: Self-pay | Admitting: Cardiovascular Disease

## 2016-04-01 ENCOUNTER — Encounter (HOSPITAL_COMMUNITY)
Admission: AD | Disposition: A | Discharge status: Home / Self Care | Payer: Self-pay | Source: Ambulatory Visit | Attending: Obstetrics and Gynecology

## 2016-04-01 ENCOUNTER — Encounter (HOSPITAL_COMMUNITY): Payer: Self-pay

## 2016-04-01 ENCOUNTER — Inpatient Hospital Stay (HOSPITAL_COMMUNITY)
Admission: AD | Admit: 2016-04-01 | Discharge: 2016-04-04 | DRG: 766 | Disposition: A | Discharge status: Home / Self Care | Payer: 59 | Source: Ambulatory Visit | Attending: Obstetrics and Gynecology | Admitting: Obstetrics and Gynecology

## 2016-04-01 ENCOUNTER — Inpatient Hospital Stay (HOSPITAL_COMMUNITY): Payer: 59 | Admitting: Anesthesiology

## 2016-04-01 DIAGNOSIS — Z23 Encounter for immunization: Secondary | ICD-10-CM | POA: Diagnosis not present

## 2016-04-01 DIAGNOSIS — Z3A39 39 weeks gestation of pregnancy: Secondary | ICD-10-CM

## 2016-04-01 LAB — ABO/RH: ABO/RH(D): O POS

## 2016-04-01 LAB — CBC
HEMATOCRIT: 34.6 % — AB (ref 36.0–46.0)
HEMOGLOBIN: 11.2 g/dL — AB (ref 12.0–15.0)
MCH: 25.7 pg — AB (ref 26.0–34.0)
MCHC: 32.4 g/dL (ref 30.0–36.0)
MCV: 79.4 fL (ref 78.0–100.0)
Platelets: 396 10*3/uL (ref 150–400)
RBC: 4.36 MIL/uL (ref 3.87–5.11)
RDW: 16 % — ABNORMAL HIGH (ref 11.5–15.5)
WBC: 11 10*3/uL — ABNORMAL HIGH (ref 4.0–10.5)

## 2016-04-01 LAB — TYPE AND SCREEN
ABO/RH(D): O POS
ANTIBODY SCREEN: NEGATIVE

## 2016-04-01 LAB — OB RESULTS CONSOLE GBS: GBS: NEGATIVE

## 2016-04-01 LAB — RPR: RPR: NONREACTIVE

## 2016-04-01 SURGERY — Surgical Case
Anesthesia: Epidural

## 2016-04-01 MED ORDER — ONDANSETRON HCL 4 MG/2ML IJ SOLN
INTRAMUSCULAR | Status: AC
  Filled 2016-04-01: qty 2

## 2016-04-01 MED ORDER — SCOPOLAMINE 1 MG/3DAYS TD PT72
MEDICATED_PATCH | TRANSDERMAL | Status: DC | PRN
  Administered 2016-04-01: 1 via TRANSDERMAL

## 2016-04-01 MED ORDER — ONDANSETRON HCL 4 MG/2ML IJ SOLN: 4.0000 mg | Freq: Four times a day (QID) | INTRAMUSCULAR | Status: DC | PRN

## 2016-04-01 MED ORDER — TERBUTALINE SULFATE 1 MG/ML IJ SOLN: 0.2500 mg | Freq: Once | INTRAMUSCULAR | Status: DC | PRN

## 2016-04-01 MED ORDER — OXYTOCIN 40 UNITS IN LACTATED RINGERS INFUSION - SIMPLE MED
1.0000 m[IU]/min | INTRAVENOUS | Status: DC
  Administered 2016-04-01: 2 m[IU]/min via INTRAVENOUS

## 2016-04-01 MED ORDER — CEFAZOLIN SODIUM-DEXTROSE 2-4 GM/100ML-% IV SOLN
INTRAVENOUS | Status: AC
  Filled 2016-04-01: qty 100

## 2016-04-01 MED ORDER — FENTANYL 2.5 MCG/ML BUPIVACAINE 1/10 % EPIDURAL INFUSION (WH - ANES)
14.0000 mL/h | INTRAMUSCULAR | Status: DC | PRN
  Administered 2016-04-01 (×2): 14 mL/h via EPIDURAL
  Filled 2016-04-01 (×2): qty 125

## 2016-04-01 MED ORDER — EPHEDRINE 5 MG/ML INJ: 10.0000 mg | INTRAVENOUS | Status: DC | PRN

## 2016-04-01 MED ORDER — LIDOCAINE HCL (PF) 1 % IJ SOLN
INTRAMUSCULAR | Status: DC | PRN
  Administered 2016-04-01 (×2): 6 mL

## 2016-04-01 MED ORDER — SODIUM CHLORIDE 0.9 % IV SOLN
INTRAVENOUS | Status: DC | PRN
  Administered 2016-04-01: 1000 mL via INTRAMUSCULAR

## 2016-04-01 MED ORDER — PHENYLEPHRINE 40 MCG/ML (10ML) SYRINGE FOR IV PUSH (FOR BLOOD PRESSURE SUPPORT)
80.0000 ug | PREFILLED_SYRINGE | INTRAVENOUS | Status: DC | PRN
  Filled 2016-04-01: qty 10

## 2016-04-01 MED ORDER — OXYTOCIN 40 UNITS IN LACTATED RINGERS INFUSION - SIMPLE MED
2.5000 [IU]/h | INTRAVENOUS | Status: DC
  Filled 2016-04-01: qty 1000

## 2016-04-01 MED ORDER — METHYLERGONOVINE MALEATE 0.2 MG/ML IJ SOLN
INTRAMUSCULAR | Status: DC | PRN
  Administered 2016-04-01: 0.2 mg via INTRAMUSCULAR

## 2016-04-01 MED ORDER — DIPHENHYDRAMINE HCL 50 MG/ML IJ SOLN: 12.5000 mg | INTRAMUSCULAR | Status: DC | PRN

## 2016-04-01 MED ORDER — OXYCODONE-ACETAMINOPHEN 5-325 MG PO TABS: 2.0000 | ORAL_TABLET | ORAL | Status: DC | PRN

## 2016-04-01 MED ORDER — OXYCODONE-ACETAMINOPHEN 5-325 MG PO TABS: 1.0000 | ORAL_TABLET | ORAL | Status: DC | PRN

## 2016-04-01 MED ORDER — PHENYLEPHRINE 40 MCG/ML (10ML) SYRINGE FOR IV PUSH (FOR BLOOD PRESSURE SUPPORT)
PREFILLED_SYRINGE | INTRAVENOUS | Status: AC
  Filled 2016-04-01: qty 10

## 2016-04-01 MED ORDER — SCOPOLAMINE 1 MG/3DAYS TD PT72
MEDICATED_PATCH | TRANSDERMAL | Status: AC
Start: 2016-04-01 — End: 2016-04-01
  Filled 2016-04-01: qty 1

## 2016-04-01 MED ORDER — CEFAZOLIN SODIUM-DEXTROSE 2-3 GM-% IV SOLR
INTRAVENOUS | Status: DC | PRN
  Administered 2016-04-01: 2 g via INTRAVENOUS

## 2016-04-01 MED ORDER — MORPHINE SULFATE-NACL 0.5-0.9 MG/ML-% IV SOSY
PREFILLED_SYRINGE | INTRAVENOUS | Status: AC
  Filled 2016-04-01: qty 6

## 2016-04-01 MED ORDER — OXYTOCIN 10 UNIT/ML IJ SOLN
INTRAMUSCULAR | Status: AC
  Filled 2016-04-01: qty 4

## 2016-04-01 MED ORDER — LACTATED RINGERS IV SOLN: 500.0000 mL | Freq: Once | INTRAVENOUS | Status: DC

## 2016-04-01 MED ORDER — FLEET ENEMA 7-19 GM/118ML RE ENEM: 1.0000 | ENEMA | RECTAL | Status: DC | PRN

## 2016-04-01 MED ORDER — OXYTOCIN BOLUS FROM INFUSION: 500.0000 mL | Freq: Once | INTRAVENOUS | Status: DC

## 2016-04-01 MED ORDER — PHENYLEPHRINE 40 MCG/ML (10ML) SYRINGE FOR IV PUSH (FOR BLOOD PRESSURE SUPPORT): 80.0000 ug | PREFILLED_SYRINGE | INTRAVENOUS | Status: DC | PRN

## 2016-04-01 MED ORDER — LACTATED RINGERS IV SOLN
INTRAVENOUS | Status: DC
  Administered 2016-04-01: via INTRAVENOUS

## 2016-04-01 MED ORDER — LACTATED RINGERS IV SOLN
INTRAVENOUS | Status: DC | PRN
  Administered 2016-04-01 (×2): via INTRAVENOUS

## 2016-04-01 MED ORDER — LIDOCAINE HCL (PF) 1 % IJ SOLN: 30.0000 mL | INTRAMUSCULAR | Status: DC | PRN

## 2016-04-01 MED ORDER — ACETAMINOPHEN 325 MG PO TABS: 650.0000 mg | ORAL_TABLET | ORAL | Status: DC | PRN

## 2016-04-01 MED ORDER — LACTATED RINGERS IV SOLN
500.0000 mL | INTRAVENOUS | Status: DC | PRN
  Administered 2016-04-01: 500 mL via INTRAVENOUS

## 2016-04-01 MED ORDER — ONDANSETRON HCL 4 MG/2ML IJ SOLN
INTRAMUSCULAR | Status: DC | PRN
  Administered 2016-04-01: 4 mg via INTRAVENOUS

## 2016-04-01 MED ORDER — PHENYLEPHRINE HCL 10 MG/ML IJ SOLN
INTRAMUSCULAR | Status: DC | PRN
  Administered 2016-04-01: 80 ug via INTRAVENOUS
  Administered 2016-04-01: 40 ug via INTRAVENOUS

## 2016-04-01 MED ORDER — SOD CITRATE-CITRIC ACID 500-334 MG/5ML PO SOLN
30.0000 mL | ORAL | Status: DC | PRN
  Administered 2016-04-01: 30 mL via ORAL
  Filled 2016-04-01: qty 15

## 2016-04-01 MED ORDER — OXYTOCIN 10 UNIT/ML IJ SOLN
INTRAVENOUS | Status: DC | PRN
  Administered 2016-04-01: 40 [IU] via INTRAVENOUS
  Administered 2016-04-01: 23:00:00 via INTRAVENOUS

## 2016-04-01 MED ORDER — LEVOTHYROXINE SODIUM 50 MCG PO TABS
138.0000 ug | ORAL_TABLET | Freq: Every day | ORAL | Status: DC
  Administered 2016-04-01: 138 ug via ORAL
  Filled 2016-04-01 (×2): qty 1

## 2016-04-01 SURGICAL SUPPLY — 35 items
APL SKNCLS STERI-STRIP NONHPOA (GAUZE/BANDAGES/DRESSINGS) ×1
BENZOIN TINCTURE PRP APPL 2/3 (GAUZE/BANDAGES/DRESSINGS) ×2 IMPLANT
CHLORAPREP W/TINT 26ML (MISCELLANEOUS) ×2 IMPLANT
CLAMP CORD UMBIL (MISCELLANEOUS) IMPLANT
CLOSURE STERI STRIP 1/2 X4 (GAUZE/BANDAGES/DRESSINGS) ×2 IMPLANT
CLOTH BEACON ORANGE TIMEOUT ST (SAFETY) ×2 IMPLANT
DRSG OPSITE POSTOP 4X10 (GAUZE/BANDAGES/DRESSINGS) ×2 IMPLANT
ELECT REM PT RETURN 9FT ADLT (ELECTROSURGICAL) ×2
ELECTRODE REM PT RTRN 9FT ADLT (ELECTROSURGICAL) ×1 IMPLANT
EXTRACTOR VACUUM KIWI (MISCELLANEOUS) IMPLANT
GLOVE BIO SURGEON STRL SZ 6.5 (GLOVE) ×2 IMPLANT
GLOVE BIOGEL PI IND STRL 6.5 (GLOVE) ×1 IMPLANT
GLOVE BIOGEL PI IND STRL 7.0 (GLOVE) ×2 IMPLANT
GLOVE BIOGEL PI INDICATOR 6.5 (GLOVE) ×1
GLOVE BIOGEL PI INDICATOR 7.0 (GLOVE) ×2
GOWN STRL REUS W/TWL LRG LVL3 (GOWN DISPOSABLE) ×4 IMPLANT
KIT ABG SYR 3ML LUER SLIP (SYRINGE) ×2 IMPLANT
LIQUID BAND (GAUZE/BANDAGES/DRESSINGS) ×2 IMPLANT
NEEDLE HYPO 25X5/8 SAFETYGLIDE (NEEDLE) ×2 IMPLANT
NS IRRIG 1000ML POUR BTL (IV SOLUTION) ×2 IMPLANT
PACK C SECTION WH (CUSTOM PROCEDURE TRAY) ×2 IMPLANT
PAD OB MATERNITY 4.3X12.25 (PERSONAL CARE ITEMS) ×2 IMPLANT
PENCIL SMOKE EVAC W/HOLSTER (ELECTROSURGICAL) ×2 IMPLANT
RETRACTOR WND ALEXIS 25 LRG (MISCELLANEOUS) IMPLANT
RTRCTR WOUND ALEXIS 25CM LRG (MISCELLANEOUS)
SUT PLAIN 0 NONE (SUTURE) IMPLANT
SUT PLAIN 2 0 (SUTURE) ×2
SUT PLAIN ABS 2-0 CT1 27XMFL (SUTURE) ×1 IMPLANT
SUT VIC AB 0 CT1 36 (SUTURE) ×4 IMPLANT
SUT VIC AB 0 CTX 36 (SUTURE) ×6
SUT VIC AB 0 CTX36XBRD ANBCTRL (SUTURE) ×3 IMPLANT
SUT VIC AB 4-0 KS 27 (SUTURE) IMPLANT
SUT VIC AB 4-0 PS2 27 (SUTURE) ×4 IMPLANT
TOWEL OR 17X24 6PK STRL BLUE (TOWEL DISPOSABLE) ×2 IMPLANT
TRAY FOLEY CATH SILVER 14FR (SET/KITS/TRAYS/PACK) IMPLANT

## 2016-04-01 NOTE — Anesthesia Pain Management Evaluation Note (Signed)
  CRNA Pain Management Visit Note  Patient: Andrea BarterAlexandria Mathena Grumbine, 25 y.o., female  "Hello I am a member of the anesthesia team at [facility]. We have an anesthesia team available at all times to provide care throughout the hospital, including epidural management and anesthesia for C-section. I don't know your plan for the delivery whether it a natural birth, water birth, IV sedation, nitrous supplementation, doula or epidural, but we want to meet your pain goals."   1.Was your pain managed to your expectations on prior hospitalizations?   No prior hospitalizations  2.What is your expectation for pain management during this hospitalization?     Epidural  3.How can we help you reach that goal? epidural  Record the patient's initial score and the patient's pain goal.   Pain: 4 during contractions  Pain Goal: 7 [facility] wants you to be able to say your pain was always managed very well.  Mosie Angus L 04/01/2016

## 2016-04-01 NOTE — MAU Note (Signed)
Pt presents complaining of contractions every 7 minutes since midnight. Denies leaking. Reports bloody show. 3cm in office.

## 2016-04-01 NOTE — Progress Notes (Signed)
Patient ID: Andrea Rangel, female   DOB: [birth year], 25 y.o.   MRN: [id number] S: No complaints. Comf.   O:  Vitals  Vitals:   04/01/16 1701 04/01/16 1721 04/01/16 1731 04/01/16 1801  BP: 122/88  117/70 123/85  Pulse:   88 84  Resp:  18  16  Temp:  98.2 F (36.8 C)    TempSrc:  Axillary    SpO2:      Weight:      Height:       Gen: NAD SVE: 5.5/100/-2 (unchanged from exam by RN at 2.5 hours ago) FHT: 150/mod var/ recurrent late decels  Toco: MVUs ~ 175-200 Pit @ 10  A/P: 25 yo G1P0 @ 39.5 wga who presented in latent labor now in active labor with no cervical change over 2.5 hours. FHT had been very reassuring. However, now with recurrent late decels. + scalp stim which is reassuring so will therefore continue with labor augmentation. However, if variability declines, late decels remain recurrent, and/or no change at next two hour exam despite mostly adequate contractions, will proceed with CS. Long d/w patient and husband and they agree with this plan.    Belva AgeeElise Skiler Olden MD

## 2016-04-01 NOTE — Progress Notes (Signed)
Patient ID: Andrea Rangel, female   DOB: [birth year], 24 y.o.   MRN: [id number] S: No complaints.   O:  Vitals  Vitals:   04/01/16 1250 04/01/16 1251  BP:  132/81  Pulse: 88 91  Resp:    Temp:     Gen: NAD SVE: 5/50/-2, unchanged FHT: cat 1 Toco: q 3-5 min  A/P: 25 yo G1P0 @ 39.5 wga who presented in latent labor.   # Labor: 5/50/-2 as above, unchanged - s/p AROM for thin meconium - pitocin initiated, IUPC placed  # MWB:  - h/o cardiac ablation, s/p cards, no further recs - hypothyroidism - cont synthroid 138 mcg qd - Rubella Non-immune - for MMR pp  # H/o anemia:  - hgb 11 today, reassuring  # FWB:   - FHT cat 1 - EFW 9#0  # GBS neg  Lucillie Garfinkel MD

## 2016-04-01 NOTE — Progress Notes (Signed)
Notified of pt arrival in MAU, cervical change, and FHR variable. Will admit to labor and delivery

## 2016-04-01 NOTE — Anesthesia Preprocedure Evaluation (Addendum)
Anesthesia Evaluation  Patient identified by MRN, date of birth, ID band Patient awake    Reviewed: Allergy & Precautions, NPO status , Patient's Chart, lab work & pertinent test results  Airway Mallampati: II  TM Distance: >3 FB Neck ROM: Full    Dental no notable dental hx.    Pulmonary neg pulmonary ROS,    Pulmonary exam normal breath sounds clear to auscultation       Cardiovascular negative cardio ROS Normal cardiovascular exam Rhythm:Regular Rate:Normal  S/p ablation for AVNRT   Neuro/Psych negative neurological ROS  negative psych ROS   GI/Hepatic negative GI ROS, Neg liver ROS,   Endo/Other  Hypothyroidism   Renal/GU negative Renal ROS  negative genitourinary   Musculoskeletal negative musculoskeletal ROS (+)   Abdominal   Peds negative pediatric ROS (+)  Hematology negative hematology ROS (+)   Anesthesia Other Findings   Reproductive/Obstetrics negative OB ROS                            Anesthesia Physical Anesthesia Plan  ASA: II  Anesthesia Plan: Epidural   Post-op Pain Management:    Induction: Intravenous  Airway Management Planned: Natural Airway  Additional Equipment:   Intra-op Plan:   Post-operative Plan:   Informed Consent: I have reviewed the patients History and Physical, chart, labs and discussed the procedure including the risks, benefits and alternatives for the proposed anesthesia with the patient or authorized representative who has indicated his/her understanding and acceptance.   Dental advisory given  Plan Discussed with: CRNA  Anesthesia Plan Comments:        Anesthesia Quick Evaluation

## 2016-04-01 NOTE — Anesthesia Procedure Notes (Signed)
Epidural Patient location during procedure: OB  Staffing Anesthesiologist: Aino Heckert  Preanesthetic Checklist Completed: patient identified, site marked, surgical consent, pre-op evaluation, timeout performed, IV checked, risks and benefits discussed and monitors and equipment checked  Epidural Patient position: sitting Prep: DuraPrep Patient monitoring: blood pressure and heart rate Approach: midline Location: L3-L4 Injection technique: LOR saline  Needle:  Needle type: Tuohy  Needle gauge: 17 G Needle length: 9 cm Needle insertion depth: 7 cm Catheter type: closed end flexible Catheter size: 19 Gauge Catheter at skin depth: 13 cm Test dose: negative and Other  Assessment Events: blood not aspirated, injection not painful, no injection resistance, negative IV test and no paresthesia  Additional Notes Reason for block:procedure for pain     

## 2016-04-01 NOTE — Progress Notes (Signed)
SVE unchanged - has been 5.5cm x 5 hours. Head with significant molding, remains high in the pelvis. FHT with recurrent lates and now w/periods of minimal variability. Decision made to proceed with primary LTCS for arrest of dilation in the setting of nrFHT. Risks dicussed including infection, bleeding, damage to surrounding structure, need for additional procedures, and hysterectomy. Patient and husband in agreement. Will proceed with Cs. 2g Ancef on call to OR.    Belva AgeeElise Rollo Farquhar MD

## 2016-04-01 NOTE — H&P (Signed)
Andrea Rangel is a 25 y.o. female presenting for labor.  OB History    Gravida Para Term Preterm AB Living   1             SAB TAB Ectopic Multiple Live Births                 Past Medical History:  Diagnosis Date  . AVNRT (AV nodal re-entry tachycardia) (HCC)    s/p successful ablation in 2012  . Hyperthyroidism   . Pain in joint   . SVT (supraventricular tachycardia) (HCC)    AV nodal re-entry tachycardia (Dr. Fleming) s/p ablation  . Tachycardia    Past Surgical History:  Procedure Laterality Date  . ABLATION    . BLADDER SURGERY  2012   Family History: family history includes Breast cancer (age of onset: 71) in her maternal grandmother; Heart Problems (age of onset: 72) in her maternal grandfather. Social History:  reports that she has never smoked. She has never used smokeless tobacco. She reports that she does not drink alcohol or use drugs.     Maternal Diabetes: No Genetic Screening: Normal Maternal Ultrasounds/Referrals: Normal Fetal Ultrasounds or other Referrals:  None Maternal Substance Abuse:  No Significant Maternal Medications:  None Significant Maternal Lab Results:  None Other Comments:  None  Review of Systems  All other systems reviewed and are negative.  Maternal Medical History:  Reason for admission: Contractions.   Contractions: Onset was 3-5 hours ago.   Frequency: regular.   Perceived severity is strong.    Fetal activity: Perceived fetal activity is normal.    Prenatal complications: No bleeding, PIH or preterm labor.     Dilation: 5 Effacement (%): 50 Station: -2 Exam by::  Blood pressure (!) 156/96, pulse 82, temperature 98.3 F (36.8 C), temperature source Oral, resp. rate 18, height 5' 2" (1.575 m), weight 87.5 kg (193 lb). Maternal Exam:  Uterine Assessment: Contraction strength is moderate.  Abdomen: Patient reports no abdominal tenderness. Fetal presentation: vertex  Introitus: Normal vulva. Normal vagina.   Ferning test: not done.  Nitrazine test: not done. Amniotic fluid character: meconium stained.  Cervix: Cervix evaluated by digital exam.    SVE 5/50/-2, AROM for thin meconium  Physical Exam  Constitutional: She appears well-developed and well-nourished. No distress.  Eyes: EOM are normal.  Cardiovascular: Normal rate.   Respiratory: Effort normal.  GI: Soft. She exhibits no distension. There is no rebound and no guarding.  Genitourinary: Uterus normal.  Musculoskeletal: Normal range of motion.  Skin: Skin is warm and dry.  Psychiatric: She has a normal mood and affect. Her behavior is normal.    Prenatal labs: ABO, Rh:  Rh+ Antibody:  neg Rubella:  Non-immune RPR:   NR HBsAg:   NR HIV:   NR GBS:   neg  Assessment/Plan: 25 yo G1P0 @ 39.5 wga presenting w/contractions, 3cm on arrival. Progressed to 5cm. S/p AROM for thin meconium.  # Labor: 5/50/-2 as above - s/p AROM for thin meconium - exp management, pit prn  # MWB:  - h/o cardiac ablation, s/p cards, no further recs - hypothyroidism - cont synthroid 138 mcg qd - Rubella Non-immune - for MMR pp  # H/o anemia:  - hgb 11 today, reassuring  # FWB:   - FHT cat 1 - EFW 9#0  # GBS neg   Jennifer  04/01/2016, 8:33 AM    

## 2016-04-02 ENCOUNTER — Encounter (HOSPITAL_COMMUNITY): Payer: Self-pay

## 2016-04-02 LAB — CBC
HEMATOCRIT: 24.6 % — AB (ref 36.0–46.0)
HEMOGLOBIN: 8.1 g/dL — AB (ref 12.0–15.0)
MCH: 25.8 pg — ABNORMAL LOW (ref 26.0–34.0)
MCHC: 32.9 g/dL (ref 30.0–36.0)
MCV: 78.3 fL (ref 78.0–100.0)
Platelets: 307 10*3/uL (ref 150–400)
RBC: 3.14 MIL/uL — ABNORMAL LOW (ref 3.87–5.11)
RDW: 16.2 % — AB (ref 11.5–15.5)
WBC: 15.2 10*3/uL — AB (ref 4.0–10.5)

## 2016-04-02 MED ORDER — COCONUT OIL OIL: 1.0000 "application " | TOPICAL_OIL | Status: DC | PRN

## 2016-04-02 MED ORDER — OXYCODONE-ACETAMINOPHEN 5-325 MG PO TABS: 1.0000 | ORAL_TABLET | ORAL | Status: DC | PRN

## 2016-04-02 MED ORDER — KETOROLAC TROMETHAMINE 30 MG/ML IJ SOLN
INTRAMUSCULAR | Status: AC
  Filled 2016-04-02: qty 1

## 2016-04-02 MED ORDER — DIBUCAINE 1 % RE OINT: 1.0000 "application " | TOPICAL_OINTMENT | RECTAL | Status: DC | PRN

## 2016-04-02 MED ORDER — IBUPROFEN 600 MG PO TABS
600.0000 mg | ORAL_TABLET | Freq: Four times a day (QID) | ORAL | Status: DC
  Administered 2016-04-02 – 2016-04-04 (×9): 600 mg via ORAL
  Filled 2016-04-02 (×10): qty 1

## 2016-04-02 MED ORDER — SIMETHICONE 80 MG PO CHEW
80.0000 mg | CHEWABLE_TABLET | ORAL | Status: DC
  Administered 2016-04-02 – 2016-04-03 (×2): 80 mg via ORAL
  Filled 2016-04-02 (×2): qty 1

## 2016-04-02 MED ORDER — MENTHOL 3 MG MT LOZG: 1.0000 | LOZENGE | OROMUCOSAL | Status: DC | PRN

## 2016-04-02 MED ORDER — MORPHINE SULFATE (PF) 0.5 MG/ML IJ SOLN
INTRAMUSCULAR | Status: DC | PRN
  Administered 2016-04-01: 3 mg via EPIDURAL

## 2016-04-02 MED ORDER — WITCH HAZEL-GLYCERIN EX PADS: 1.0000 "application " | MEDICATED_PAD | CUTANEOUS | Status: DC | PRN

## 2016-04-02 MED ORDER — NALOXONE HCL 0.4 MG/ML IJ SOLN: 0.4000 mg | INTRAMUSCULAR | Status: DC | PRN

## 2016-04-02 MED ORDER — PRENATAL MULTIVITAMIN CH
1.0000 | ORAL_TABLET | Freq: Every day | ORAL | Status: DC
  Administered 2016-04-02 – 2016-04-03 (×2): 1 via ORAL
  Filled 2016-04-02 (×2): qty 1

## 2016-04-02 MED ORDER — ONDANSETRON HCL 4 MG/2ML IJ SOLN: 4.0000 mg | Freq: Three times a day (TID) | INTRAMUSCULAR | Status: DC | PRN

## 2016-04-02 MED ORDER — ZOLPIDEM TARTRATE 5 MG PO TABS: 5.0000 mg | ORAL_TABLET | Freq: Every evening | ORAL | Status: DC | PRN

## 2016-04-02 MED ORDER — OXYTOCIN 40 UNITS IN LACTATED RINGERS INFUSION - SIMPLE MED: 2.5000 [IU]/h | INTRAVENOUS | Status: AC

## 2016-04-02 MED ORDER — SENNOSIDES-DOCUSATE SODIUM 8.6-50 MG PO TABS
2.0000 | ORAL_TABLET | ORAL | Status: DC
  Administered 2016-04-02: 2 via ORAL
  Filled 2016-04-02 (×2): qty 2

## 2016-04-02 MED ORDER — SIMETHICONE 80 MG PO CHEW: 80.0000 mg | CHEWABLE_TABLET | ORAL | Status: DC | PRN

## 2016-04-02 MED ORDER — SODIUM CHLORIDE 0.9% FLUSH: 3.0000 mL | INTRAVENOUS | Status: DC | PRN

## 2016-04-02 MED ORDER — SIMETHICONE 80 MG PO CHEW
80.0000 mg | CHEWABLE_TABLET | Freq: Three times a day (TID) | ORAL | Status: DC
  Administered 2016-04-02 – 2016-04-04 (×5): 80 mg via ORAL
  Filled 2016-04-02 (×6): qty 1

## 2016-04-02 MED ORDER — NALBUPHINE HCL 10 MG/ML IJ SOLN: 5.0000 mg | Freq: Once | INTRAMUSCULAR | Status: DC | PRN

## 2016-04-02 MED ORDER — KETOROLAC TROMETHAMINE 30 MG/ML IJ SOLN
30.0000 mg | Freq: Four times a day (QID) | INTRAMUSCULAR | Status: AC | PRN
  Administered 2016-04-02: 30 mg via INTRAMUSCULAR

## 2016-04-02 MED ORDER — DIPHENHYDRAMINE HCL 25 MG PO CAPS: 25.0000 mg | ORAL_CAPSULE | Freq: Four times a day (QID) | ORAL | Status: DC | PRN

## 2016-04-02 MED ORDER — DIPHENHYDRAMINE HCL 50 MG/ML IJ SOLN
12.5000 mg | INTRAMUSCULAR | Status: DC | PRN
Start: 2016-04-02 — End: 2016-04-04

## 2016-04-02 MED ORDER — SCOPOLAMINE 1 MG/3DAYS TD PT72
1.0000 | MEDICATED_PATCH | Freq: Once | TRANSDERMAL | Status: DC
  Filled 2016-04-02: qty 1

## 2016-04-02 MED ORDER — NALOXONE HCL 2 MG/2ML IJ SOSY
1.0000 ug/kg/h | PREFILLED_SYRINGE | INTRAVENOUS | Status: DC | PRN
  Filled 2016-04-02: qty 2

## 2016-04-02 MED ORDER — LEVOTHYROXINE SODIUM 137 MCG PO TABS
137.0000 ug | ORAL_TABLET | Freq: Every day | ORAL | Status: DC
  Administered 2016-04-02 – 2016-04-04 (×3): 137 ug via ORAL
  Filled 2016-04-02 (×4): qty 1

## 2016-04-02 MED ORDER — KETOROLAC TROMETHAMINE 30 MG/ML IJ SOLN: 30.0000 mg | Freq: Four times a day (QID) | INTRAMUSCULAR | Status: AC | PRN

## 2016-04-02 MED ORDER — DIPHENHYDRAMINE HCL 25 MG PO CAPS: 25.0000 mg | ORAL_CAPSULE | ORAL | Status: DC | PRN

## 2016-04-02 MED ORDER — ACETAMINOPHEN 325 MG PO TABS
650.0000 mg | ORAL_TABLET | ORAL | Status: DC | PRN
  Administered 2016-04-02: 650 mg via ORAL
  Filled 2016-04-02: qty 2

## 2016-04-02 MED ORDER — NALBUPHINE HCL 10 MG/ML IJ SOLN: 5.0000 mg | INTRAMUSCULAR | Status: DC | PRN

## 2016-04-02 MED ORDER — OXYCODONE-ACETAMINOPHEN 5-325 MG PO TABS: 2.0000 | ORAL_TABLET | ORAL | Status: DC | PRN

## 2016-04-02 MED ORDER — MEPERIDINE HCL 25 MG/ML IJ SOLN: 6.2500 mg | INTRAMUSCULAR | Status: DC | PRN

## 2016-04-02 MED ORDER — LACTATED RINGERS IV SOLN
INTRAVENOUS | Status: DC
Start: 2016-04-02 — End: 2016-04-04
  Administered 2016-04-02 (×2): via INTRAVENOUS

## 2016-04-02 NOTE — Op Note (Signed)
PROCEDURE DATE: 04/01/2016  PREOPERATIVE DIAGNOSIS: Intrauterine pregnancy at 39.5 wga, Indication: arrest of dilation and nrFHT  POSTOPERATIVE DIAGNOSIS:The same  PROCEDURE: Low TransverseCesarean Section  SURGEON: Dr. Deaira Leckey  INDICATIONS:This is a 25yo G1 at 0 wga requiring cesarean section secondary to arrest of dilation in setting of nrFHT.  Patient presented in latent labor. Progressed to 5.5cm but then remained there for 5 hours despite adequate contractions. FHT then notable for recurrent late decels with periods of minimal var. Decision made to proceed with pLTCS.The risks of cesarean section discussed with the patient included but were not limited to: bleeding which may require transfusion or reoperation; infection which may require antibiotics; injury to bowel, bladder, ureters or other surrounding organs; injury to the fetus; need for additional procedures including hysterectomy in the event of a life-threatening hemorrhage; placental abnormalities wth subsequent pregnancies, incisional problems, thromboembolic phenomenon and other postoperative/anesthesia complications. The patient agreed with the proposed plan, giving informed consent for the procedure.   FINDINGS: Viable femaleinfant in vertex presentation,APGARs 4/9, Weight pending, Amniotic fluid with meconium, Intact placenta, three vessel cord. Grossly normal uterus, ovaries and fallopian tubes.  .  ANESTHESIA: Epidural ESTIMATED BLOOD LOSS: 800cc SPECIMENS: Placenta for pathology COMPLICATIONS: None immediate   PROCEDURE IN DETAIL: The patient received intravenous antibiotics (2g Ancef) and had sequential compression devices applied to her lower extremities while in the preoperative area. Shewasthen taken to the operating roomwhere epidural anesthesiawas dosed up to surgical level andwas found to be adequate. She was then placed in a dorsal supine position with a leftward tilt,and prepped  and draped in a sterile manner.A foley catheter was placed into her bladder and attached to constant gravity. After an adequate timeout was performed, aPfannenstiel skin incision was made with scalpel and carried through to the underlying layer of fascia. The fascia was incised in the midline and this incision was extended bilaterally using the Mayo scissors. Kocher clamps were applied to the superior aspect of the fascial incision and the underlying rectus muscles were dissected off bluntly. A similar process was carried out on the inferior aspect of the facial incision. The rectus muscles were separated in the midline bluntly and the peritoneum was entered bluntly. Alexis retractor placed. A bladder flap was created sharply and developed bluntly.Atransverse hysterotomy was made with a scalpel and extended bilaterally bluntly. The bladder blade was then removed. The infant was successfully delivered, and cord was clamped and cut and infant was handed over to awaiting neonatology team. Uterine massage was then administered and the placenta delivered intact with three-vessel cord. Cord gases were taken. Cord blood donation collected.The uterus was cleared of clot and debris. The hysterotomy was closed with 0 vicryl.A second imbricating suture of 0-vicryl was used to reinforce the incision and aid in hemostasis. Uterus noted to be atonic. Pitocin doubled and methergine given x 1 with resolution of atony. Alexis retractor removed. Rectus muscles reapproximated with one figure of eight. The fascia was closed with 0-Vicryl in a running fashion with good restoration of anatomy. The subcutaneus tissue was irrigated and was reapproximated using plain gut stitches. The skin was closed with 4-0 Vicryl in a subcuticular fashion.  Final EBL was 800 (all surgical site and was hemostatic at end of procedure) without any further bleeding on exam.   Pt tolerated the procedure well. All sponge/lap/needle counts  were correct X 2. Pt taken to recovery room in stable condition.  It's a boy, "Andrea Rangel"!! Dad's name is Andrea Rangel.   Andrea Viveros MD  

## 2016-04-02 NOTE — Anesthesia Postprocedure Evaluation (Signed)
Anesthesia Post Note  Patient: Andrea Rangel  Procedure(s) Performed: Procedure(s) (LRB): CESAREAN SECTION (N/A)  Patient location during evaluation: PACU Anesthesia Type: Epidural Level of consciousness: awake and alert Pain management: pain level controlled Vital Signs Assessment: post-procedure vital signs reviewed and stable Respiratory status: spontaneous breathing and respiratory function stable Cardiovascular status: blood pressure returned to baseline and stable Postop Assessment: epidural receding Anesthetic complications: no     Last Vitals:  Vitals:   04/02/16 0130 04/02/16 0155  BP: 128/86 126/68  Pulse:  97  Resp:  20  Temp: 37.2 C 37.2 C    Last Pain:  Vitals:   04/02/16 0155  TempSrc: Oral  PainSc:    Pain Goal: Patients Stated Pain Goal: 6 (04/01/16 1108)               Kennieth RadFitzgerald, Nan Maya E

## 2016-04-02 NOTE — Anesthesia Postprocedure Evaluation (Signed)
Anesthesia Post Note  Patient: Freda JacksonAlexandria Mathena Laneve  Procedure(s) Performed: Procedure(s) (LRB): CESAREAN SECTION (N/A)  Patient location during evaluation: Mother Baby Anesthesia Type: Epidural Level of consciousness: awake and alert Pain management: pain level controlled Vital Signs Assessment: post-procedure vital signs reviewed and stable Respiratory status: spontaneous breathing, nonlabored ventilation and respiratory function stable Cardiovascular status: stable Postop Assessment: no headache, no backache and epidural receding Anesthetic complications: no     Last Vitals:  Vitals:   04/02/16 0400 04/02/16 0630  BP: (!) 122/58 118/68  Pulse: 97 100  Resp: 18 20  Temp: 36.9 C 36.7 C    Last Pain:  Vitals:   04/02/16 0630  TempSrc:   PainSc: 0-No pain   Pain Goal: Patients Stated Pain Goal: 6 (04/01/16 1108)               Junious SilkGILBERT,Shahil Speegle

## 2016-04-02 NOTE — Progress Notes (Signed)
UR chart review completed.  

## 2016-04-02 NOTE — Progress Notes (Signed)
Subjective: Postpartum Day 1: Cesarean Delivery Patient reports tolerating PO.    Objective: Vital signs in last 24 hours: Temp:  [97.9 F (36.6 C)-98.9 F (37.2 C)] 98.1 F (36.7 C) (08/24 0630) Pulse Rate:  [74-123] 100 (08/24 0630) Resp:  [16-28] 20 (08/24 0630) BP: (109-167)/(58-102) 118/68 (08/24 0630) SpO2:  [97 %-100 %] 97 % (08/24 0630)  Physical Exam:  General: alert and cooperative Lochia: appropriate Uterine Fundus: firm Incision: healing well DVT Evaluation: No evidence of DVT seen on physical exam. Negative Homan's sign. No cords or calf tenderness. No significant calf/ankle edema. PAS hoes reapplied   Recent Labs  04/01/16 0540 04/02/16 0610  HGB 11.2* 8.1*  HCT 34.6* 24.6*    Assessment/Plan: Status post Cesarean section. Doing well postoperatively.  Continue current care.  Draden Cottingham G 04/02/2016, 8:29 AM

## 2016-04-02 NOTE — Transfer of Care (Signed)
Immediate Anesthesia Transfer of Care Note  Patient: Andrea Rangel  Procedure(s) Performed: Procedure(s): CESAREAN SECTION (N/A)  Patient Location: PACU  Anesthesia Type:Epidural  Level of Consciousness: awake, alert  and oriented  Airway & Oxygen Therapy: Patient Spontanous Breathing  Post-op Assessment: Report given to RN and Post -op Vital signs reviewed and stable  Post vital signs: Reviewed and stable  Last Vitals:  Vitals:   04/01/16 2130 04/01/16 2200  BP: 129/81 124/83  Pulse: (!) 106 (!) 115  Resp:    Temp:      Last Pain:  Vitals:   04/01/16 2130  TempSrc:   PainSc: 0-No pain      Patients Stated Pain Goal: 6 (04/01/16 1108)  Complications: No apparent anesthesia complications

## 2016-04-03 ENCOUNTER — Encounter: Payer: Self-pay | Admitting: Obstetrics and Gynecology

## 2016-04-03 MED ORDER — IBUPROFEN 600 MG PO TABS: 600.0000 mg | ORAL_TABLET | Freq: Four times a day (QID) | ORAL | 1 refills | Status: DC

## 2016-04-03 NOTE — Discharge Summary (Signed)
Obstetric Discharge Summary Reason for Admission: onset of labor Prenatal Procedures: ultrasound Intrapartum Procedures: cesarean: low cervical, transverse Postpartum Procedures: none Complications-Operative and Postpartum: none Hemoglobin  Date Value Ref Range Status  04/02/2016 8.1 (L) 12.0 - 15.0 g/dL Final    Comment:    REPEATED TO VERIFY DELTA CHECK NOTED    HCT  Date Value Ref Range Status  04/02/2016 24.6 (L) 36.0 - 46.0 % Final    Physical Exam:  General: alert and cooperative Lochia: appropriate Uterine Fundus: firm Incision: healing well DVT Evaluation: No evidence of DVT seen on physical exam. Negative Homan's sign. No cords or calf tenderness. Calf/Ankle edema is present.  Discharge Diagnoses: Term Pregnancy-delivered  Discharge Information: Date: 04/03/2016 Activity: pelvic rest Diet: routine Medications: PNV and Ibuprofen and Synthroid Condition: stable Instructions: refer to practice specific booklet Discharge to: home   Newborn Data: Live born female  Birth Weight: 8 lb 12.9 oz (3995 g) APGAR: 4, 9  Home with mother.  Stefanny Pieri G 04/03/2016, 8:12 AM

## 2016-04-03 NOTE — Progress Notes (Signed)
Infant not able to be discharged today; requested to cancel D/C for today; received a verbal order to D/C.

## 2016-04-04 MED ORDER — MEASLES, MUMPS & RUBELLA VAC ~~LOC~~ INJ
0.5000 mL | INJECTION | Freq: Once | SUBCUTANEOUS | Status: AC
  Administered 2016-04-04: 0.5 mL via SUBCUTANEOUS
  Filled 2016-04-04: qty 0.5

## 2016-04-04 NOTE — Progress Notes (Signed)
Subjective: Postpartum Day 3: Cesarean Delivery Patient reports tolerating PO, + flatus and no problems voiding.    Objective: Vital signs in last 24 hours: Temp:  [97.5 F (36.4 C)-98.4 F (36.9 C)] 97.5 F (36.4 C) (08/26 0550) Pulse Rate:  [86-110] 86 (08/26 0550) Resp:  [17-18] 18 (08/26 0550) BP: (120-122)/(71-81) 122/81 (08/26 0550)  Physical Exam:  General: alert and cooperative Lochia: appropriate Uterine Fundus: firm Incision: no significant drainage DVT Evaluation: No evidence of DVT seen on physical exam.   Recent Labs  04/02/16 0610  HGB 8.1*  HCT 24.6*    Assessment/Plan: Status post Cesarean section. Doing well postoperatively.  Discharge home with standard precautions and return to clinic in 4-6 weeks.  Andrea Rangel 04/04/2016, 7:44 AM

## 2016-11-12 ENCOUNTER — Encounter (HOSPITAL_COMMUNITY): Payer: Self-pay

## 2016-11-12 ENCOUNTER — Emergency Department (HOSPITAL_COMMUNITY)
Admission: EM | Admit: 2016-11-12 | Discharge: 2016-11-12 | Disposition: A | Discharge status: Home / Self Care | Payer: 59 | Attending: Emergency Medicine | Admitting: Emergency Medicine

## 2016-11-12 DIAGNOSIS — Z79899 Other long term (current) drug therapy: Secondary | ICD-10-CM | POA: Insufficient documentation

## 2016-11-12 DIAGNOSIS — N12 Tubulo-interstitial nephritis, not specified as acute or chronic: Secondary | ICD-10-CM | POA: Insufficient documentation

## 2016-11-12 LAB — URINALYSIS, ROUTINE W REFLEX MICROSCOPIC
BILIRUBIN URINE: NEGATIVE
GLUCOSE, UA: NEGATIVE mg/dL
HGB URINE DIPSTICK: NEGATIVE
Ketones, ur: NEGATIVE mg/dL
NITRITE: NEGATIVE
PH: 6 (ref 5.0–8.0)
Protein, ur: NEGATIVE mg/dL
SPECIFIC GRAVITY, URINE: 1.025 (ref 1.005–1.030)

## 2016-11-12 LAB — POC URINE PREG, ED: Preg Test, Ur: NEGATIVE

## 2016-11-12 MED ORDER — CEPHALEXIN 500 MG PO CAPS: 500.0000 mg | ORAL_CAPSULE | Freq: Four times a day (QID) | ORAL | 0 refills | Status: DC

## 2016-11-12 MED ORDER — HYDROCODONE-ACETAMINOPHEN 5-325 MG PO TABS
2.0000 | ORAL_TABLET | Freq: Once | ORAL | Status: AC
  Administered 2016-11-12: 2 via ORAL
  Filled 2016-11-12: qty 2

## 2016-11-12 MED ORDER — ONDANSETRON 8 MG PO TBDP
8.0000 mg | ORAL_TABLET | Freq: Once | ORAL | Status: AC
  Administered 2016-11-12: 8 mg via ORAL
  Filled 2016-11-12: qty 1

## 2016-11-12 MED ORDER — IBUPROFEN 800 MG PO TABS
800.0000 mg | ORAL_TABLET | Freq: Once | ORAL | Status: AC
  Administered 2016-11-12: 800 mg via ORAL
  Filled 2016-11-12: qty 1

## 2016-11-12 MED ORDER — CEPHALEXIN 500 MG PO CAPS
500.0000 mg | ORAL_CAPSULE | Freq: Once | ORAL | Status: AC
  Administered 2016-11-12: 500 mg via ORAL
  Filled 2016-11-12: qty 1

## 2016-11-12 NOTE — ED Triage Notes (Signed)
Pt reports left flank pain that started approx 11 pm, states she took tylenol at that time and also removed her nuvaring wondering if that was causing the pain.  Pt is uncomfortable in lower back and left flank and reports frequency with urination.

## 2016-11-12 NOTE — ED Provider Notes (Signed)
[facility]-EMERGENCY DEPT Provider Note   CSN: [id number] Arrival date & time: 11/12/16  0018  By signing my name below, I, Diona Browner, attest that this documentation has been prepared under the direction and in the presence of Zadie Rhine, MD. Electronically Signed: Diona Browner, ED Scribe. 11/12/16. 12:58 AM.   History   Chief Complaint Chief Complaint  Patient presents with  . Flank Pain    HPI Andrea Rangel is a 26 y.o. female with a PSHx of an Ablation, who presents to the Emergency Department complaining of sudden shooting left flank pain that started around 11 pm on 11/11/16. Pt notes that's pain radiates to her LL abdomin and groin. Associated sx include nausea, vomiting and frequent urination. She is no longer on blood thinners and only takes her thyroid medication as prescribed. She is not currently breastfeeding. Pt denies diarrhea, dysuria, vaginal bleeding or discharge and cough.    The history is provided by the patient and medical records. No language interpreter was used.  Flank Pain  This is a new problem. The current episode started 1 to 2 hours ago. The problem occurs constantly. The problem has not changed since onset.Associated symptoms include abdominal pain. Nothing relieves the symptoms. She has tried nothing for the symptoms. The treatment provided no relief.    Past Medical History:  Diagnosis Date  . AVNRT (AV nodal re-entry tachycardia) (HCC)    s/p successful ablation in 2012  . Hyperthyroidism   . Pain in joint   . SVT (supraventricular tachycardia) (HCC)    AV nodal re-entry tachycardia (Dr. Meredeth Ide) s/p ablation  . Tachycardia     Patient Active Problem List   Diagnosis Date Noted  . Normal labor and delivery 04/01/2016  . AVNRT (AV nodal re-entry tachycardia) (HCC)   . Hyperthyroidism   . Tachycardia   . Pain in joint   . SVT (supraventricular tachycardia) (HCC)     Past Surgical History:  Procedure Laterality Date  .  ABLATION    . BLADDER SURGERY  2012  . CESAREAN SECTION N/A 04/01/2016   Procedure: CESAREAN SECTION;  Surgeon: Ranae Pila, MD;  Location: [facility] BIRTHING SUITES;  Service: Obstetrics;  Laterality: N/A;    OB History    Gravida Para Term Preterm AB Living   SAB TAB Ectopic Multiple Live Births         0 1       Home Medications    Prior to Admission medications   Medication Sig Start Date End Date Taking? Authorizing Provider  acetaminophen (TYLENOL) 500 MG tablet Take 1,000 mg by mouth every 6 (six) hours as needed for mild pain.    Historical Provider, MD  ibuprofen (ADVIL,MOTRIN) 600 MG tablet Take 1 tablet (600 mg total) by mouth every 6 (six) hours. 04/03/16   Julio Sicks, NP  levothyroxine (SYNTHROID, LEVOTHROID) 137 MCG tablet Take 137 mcg by mouth daily before breakfast.  03/31/16   Historical Provider, MD  Prenatal Vit-Fe Fumarate-FA (PRENATAL PO) Take 2 Doses by mouth daily.    Historical Provider, MD    Family History Family History  Problem Relation Age of Onset  . Breast cancer Maternal Grandmother 51  . Heart Problems Maternal Grandfather 22    Social History Social History  Substance Use Topics  . Smoking status: Never Smoker  . Smokeless tobacco: Never Used  . Alcohol use No     Allergies   Patient has no  known allergies.   Review of Systems Review of Systems  Constitutional: Negative for fever.  Respiratory: Negative for cough.   Gastrointestinal: Positive for abdominal pain, nausea and vomiting. Negative for diarrhea.  Genitourinary: Positive for dyspareunia, flank pain and frequency. Negative for dysuria, vaginal bleeding and vaginal discharge.  All other systems reviewed and are negative.    Physical Exam Updated Vital Signs BP 122/69 (BP Location: Right Arm)   Pulse 63   Temp 97.8 F (36.6 C) (Oral)   Resp 20   Ht  (1.6 m)   Wt 159 lb (72.1 kg)   LMP 10/25/2016 (Approximate)   SpO2 100%   BMI 28.17 kg/m    Physical Exam  CONSTITUTIONAL: Well developed/well nourished HEAD: Normocephalic/atraumatic EYES: EOMI/PERRL ENMT: Mucous membranes moist NECK: supple no meningeal signs SPINE/BACK:entire spine nontender CV: S1/S2 noted, no murmurs/rubs/gallops noted LUNGS: Lungs are clear to auscultation bilaterally, no apparent distress ABDOMEN: soft, nontender, no rebound or guarding, bowel sounds noted throughout abdomen [id number] cva tenderness NEURO: Pt is awake/alert/appropriate, moves all extremitiesx4.  No facial droop.   EXTREMITIES: pulses normal/equal, full ROM SKIN: warm, color normal PSYCH: Anxious   ED Treatments / Results  DIAGNOSTIC STUDIES: Oxygen Saturation is 100% on RA, normal by my interpretation.   COORDINATION OF CARE: 12:52 AM-Discussed next steps with pt. Pt verbalized understanding and is agreeable with the plan.    Labs (all labs ordered are listed, but only abnormal results are displayed) Labs Reviewed  URINALYSIS, ROUTINE W REFLEX MICROSCOPIC - Abnormal; Notable for the following:       Result Value   APPearance CLOUDY (*)    Leukocytes, UA MODERATE (*)    Bacteria, UA RARE (*)    Squamous Epithelial / LPF 6-30 (*)    All other components within normal limits  POC URINE PREG, ED    EKG  EKG Interpretation None       Radiology No results found.  Procedures Procedures    Medications Ordered in ED Medications  cephALEXin (KEFLEX) capsule 500 mg (not administered)  ondansetron (ZOFRAN-ODT) disintegrating tablet 8 mg (8 mg Oral Given 11/12/16 0102)  HYDROcodone-acetaminophen (NORCO/VICODIN) 5-325 MG per tablet 2 tablet (2 tablets Oral Given 11/12/16 0101)  ibuprofen (ADVIL,MOTRIN) tablet 800 mg (800 mg Oral Given 11/12/16 0102)     Initial Impression / Assessment and Plan / ED Course  I have reviewed the triage vital signs and the nursing notes.  Pertinent labs results that were available during my care of the patient were reviewed by me and  considered in my medical decision making (see chart for details).     Pt improved She is in no distress She is NOT septic appearing Likely early PYELO I feel she is appropriate for outpatient management We discussed strict ER return precautions   Final Clinical Impressions(s) / ED Diagnoses   Final diagnoses:  Pyelonephritis    New Prescriptions New Prescriptions   CEPHALEXIN (KEFLEX) 500 MG CAPSULE    Take 1 capsule (500 mg total) by mouth 4 (four) times daily.   I personally performed the services described in this documentation, which was scribed in my presence. The recorded information has been reviewed and is accurate.       Zadie Rhine, MD 11/12/16 [phone]

## 2016-11-28 ENCOUNTER — Emergency Department (HOSPITAL_COMMUNITY)
Admission: EM | Admit: 2016-11-28 | Discharge: 2016-11-29 | Disposition: A | Discharge status: Home / Self Care | Payer: 59 | Attending: Emergency Medicine | Admitting: Emergency Medicine

## 2016-11-28 ENCOUNTER — Encounter (HOSPITAL_COMMUNITY): Payer: Self-pay

## 2016-11-28 DIAGNOSIS — Z79899 Other long term (current) drug therapy: Secondary | ICD-10-CM | POA: Diagnosis not present

## 2016-11-28 DIAGNOSIS — N3000 Acute cystitis without hematuria: Secondary | ICD-10-CM | POA: Diagnosis not present

## 2016-11-28 DIAGNOSIS — N201 Calculus of ureter: Secondary | ICD-10-CM | POA: Diagnosis not present

## 2016-11-28 DIAGNOSIS — R1012 Left upper quadrant pain: Secondary | ICD-10-CM | POA: Diagnosis present

## 2016-11-28 NOTE — ED Triage Notes (Signed)
Pt was seen about a week ago here and dx with pyelonephritis and discharged on antibiotics. Pt reports finishing anbx, but left kidney pain, frequency, and urgency started again tonight.

## 2016-11-29 ENCOUNTER — Emergency Department (HOSPITAL_COMMUNITY): Payer: 59

## 2016-11-29 LAB — URINALYSIS, ROUTINE W REFLEX MICROSCOPIC
BILIRUBIN URINE: NEGATIVE
Glucose, UA: NEGATIVE mg/dL
KETONES UR: NEGATIVE mg/dL
NITRITE: NEGATIVE
Protein, ur: NEGATIVE mg/dL
Specific Gravity, Urine: 1.021 (ref 1.005–1.030)
pH: 6 (ref 5.0–8.0)

## 2016-11-29 LAB — PREGNANCY, URINE: Preg Test, Ur: NEGATIVE

## 2016-11-29 MED ORDER — CIPROFLOXACIN HCL 250 MG PO TABS
500.0000 mg | ORAL_TABLET | Freq: Once | ORAL | Status: AC
  Administered 2016-11-29: 500 mg via ORAL
  Filled 2016-11-29: qty 2

## 2016-11-29 MED ORDER — OXYCODONE-ACETAMINOPHEN 5-325 MG PO TABS: 1.0000 | ORAL_TABLET | Freq: Four times a day (QID) | ORAL | 0 refills | Status: DC | PRN

## 2016-11-29 MED ORDER — CIPROFLOXACIN HCL 500 MG PO TABS: 500.0000 mg | ORAL_TABLET | Freq: Two times a day (BID) | ORAL | 0 refills | Status: DC

## 2016-11-29 MED ORDER — ONDANSETRON HCL 4 MG/2ML IJ SOLN
4.0000 mg | Freq: Once | INTRAMUSCULAR | Status: AC
  Administered 2016-11-29: 4 mg via INTRAVENOUS
  Filled 2016-11-29: qty 2

## 2016-11-29 MED ORDER — KETOROLAC TROMETHAMINE 30 MG/ML IJ SOLN
30.0000 mg | Freq: Once | INTRAMUSCULAR | Status: AC
  Administered 2016-11-29: 30 mg via INTRAVENOUS
  Filled 2016-11-29: qty 1

## 2016-11-29 MED ORDER — OXYCODONE-ACETAMINOPHEN 5-325 MG PO TABS: 1.0000 | ORAL_TABLET | ORAL | 0 refills | Status: DC | PRN

## 2016-11-29 MED ORDER — TAMSULOSIN HCL 0.4 MG PO CAPS: ORAL_CAPSULE | ORAL | 0 refills | Status: DC

## 2016-11-29 MED ORDER — ONDANSETRON HCL 4 MG PO TABS: 4.0000 mg | ORAL_TABLET | Freq: Three times a day (TID) | ORAL | 0 refills | Status: DC | PRN

## 2016-11-29 NOTE — Discharge Instructions (Signed)
Drink plenty of fluids. Try to avoid drinking sodas, caffeine, a lot of milk.  Take the medications as prescribed.  Recheck with Dr Retta Diones if you aren't passing the stone this week. Return to the ED if you get fever, or have uncontrolled vomiting or pain.

## 2016-11-29 NOTE — ED Provider Notes (Signed)
[facility]-EMERG[facility]Y DEPT Provider Note   CSN: [id number] Arrival date & time: 11/28/16  2343  By signing my name below, I, Bing Neighbors., attest that this documentation has been prepared under the direction and in the presence of Devoria Albe, MD. Electronically signed: Bing Neighbors., ED Scribe. 11/29/16. 2:09 AM.  Time seen 12:09 AM  History   Chief Complaint Chief Complaint  Patient presents with  . Back Pain  . Dysuria    HPI Andrea Rangel is a 26 y.o. female who presents to the Emergency Department complaining of back pain that started about 9:45 PM tonight.  Pt states that she developed back pain x2 days ago that is similar to the pain she experienced x2 weeks ago with a kidney infection. Pt states that x2 days ago, she began experiencing burning and itching in her groin which she thought was a yeast infection. She reportedly tried a topical cream at this time with no relief. x1 day ago she started experiencing L sided back pain that she describes as pressure. Pt reports frequency, urgency, decreased urination, abdominal pain, nausea, x1 episode of vomiting around 10:30 pm. Pt took tylenol x2 hours ago with mild relief. Pt states that standing and pressure over her left side helps relieves the pain. Nothing makes the pain worse. She denies hematuria, cough. Of note, pt was placed on a 7 day course of Keflex for the kidney infection x2 weeks ago. Her symptoms did improve. Pt reports hx of recurrent UTIs' during pregnancy. She has never had this pain before. She denies FH of renal stones. She does drink a lot of milk and does drink coffee.  She states she feels like she can't sit still b/o the pain.   The history is provided by the patient and the spouse. No language interpreter was used.    Past Medical History:  Diagnosis Date  . AVNRT (AV nodal re-entry tachycardia) (HCC)    s/p successful ablation in 2012  . Hyperthyroidism   . Pain in joint   . SVT  (supraventricular tachycardia) (HCC)    AV nodal re-entry tachycardia (Dr. Meredeth Ide) s/p ablation  . Tachycardia     Patient Active Problem List   Diagnosis Date Noted  . Normal labor and delivery 04/01/2016  . AVNRT (AV nodal re-entry tachycardia) (HCC)   . Hyperthyroidism   . Tachycardia   . Pain in joint   . SVT (supraventricular tachycardia) (HCC)     Past Surgical History:  Procedure Laterality Date  . ABLATION    . BLADDER SURGERY  2012  . CESAREAN SECTION N/A 04/01/2016   Procedure: CESAREAN SECTION;  Surgeon: Ranae Pila, MD;  Location: [facility] BIR[facility]ING [facility];  Service: Obstetrics;  Laterality: N/A;    OB History    Gravida Para Term Preterm AB Living   SAB TAB Ectopic Multiple Live Births         0 1       Home Medications    Prior to Admission medications   Medication Sig Start Date End Date Taking? Authorizing Provider  acetaminophen (TYLENOL) 500 MG tablet Take 1,000 mg by mouth every 6 (six) hours as needed for mild pain.    Historical Provider, MD  cephALEXin (KEFLEX) 500 MG capsule Take 1 capsule (500 mg total) by mouth 4 (four) times daily. 11/12/16   Zadie Rhine, MD  ciprofloxacin (CIPRO) 500 MG tablet Take 1 tablet (500 mg total)  by mouth 2 (two) times daily. 11/29/16   Devoria Albe, MD  ibuprofen (ADVIL,MOTRIN) 600 MG tablet Take 1 tablet (600 mg total) by mouth every 6 (six) hours. 04/03/16   Julio Sicks, NP  levothyroxine (SYNTHROID, LEVOTHROID) 137 MCG tablet Take 137 mcg by mouth daily before breakfast.  03/31/16   Historical Provider, MD  ondansetron (ZOFRAN) 4 MG tablet Take 1 tablet (4 mg total) by mouth every 8 (eight) hours as needed for nausea or vomiting. 11/29/16   Devoria Albe, MD  oxyCODONE-acetaminophen (PERCOCET/ROXICET) 5-325 MG tablet Take 1 tablet by mouth every 4 (four) hours as needed for severe pain. 11/29/16   Devoria Albe, MD  oxyCODONE-acetaminophen (PERCOCET/ROXICET) 5-325 MG tablet Take 1 tablet by mouth every 6 (six)  hours as needed for severe pain. 11/29/16   Devoria Albe, MD  Prenatal Vit-Fe Fumarate-FA (PRENATAL PO) Take 2 Doses by mouth daily.    Historical Provider, MD  tamsulosin (FLOMAX) 0.4 MG CAPS capsule Take 1 po QD until you pass the stone. 11/29/16   Devoria Albe, MD    Family History Family History  Problem Relation Age of Onset  . Breast cancer Maternal Grandmother 60  . Heart Problems Maternal Grandfather 48    Social History Social History  Substance Use Topics  . Smoking status: Never Smoker  . Smokeless tobacco: Never Used  . Alcohol use No  has a 7 month at home   Allergies   Patient has no known allergies.   Review of Systems Review of Systems  Constitutional: Positive for chills and fever.  Respiratory: Negative for cough.   Gastrointestinal: Positive for abdominal pain, nausea and vomiting. Negative for rectal pain.  Genitourinary: Positive for decreased urine volume, frequency and urgency. Negative for hematuria.  Musculoskeletal: Positive for back pain.  All other systems reviewed and are negative.    Physical Exam Updated Vital Signs BP 108/78   Pulse (!) 55   Temp 98 F (36.7 C) (Oral)   Resp 17   Ht  (1.6 m)   Wt 159 lb (72.1 kg)   LMP 11/14/2016   SpO2 99%   BMI 28.17 kg/m   Physical Exam  Constitutional: She is oriented to person, place, and time. She appears well-developed and well-nourished.  Non-toxic appearance. She does not appear ill. No distress.  Pt appears uncomfortable.   HENT:  Head: Normocephalic and atraumatic.  Right Ear: External ear normal.  Left Ear: External ear normal.  Nose: Nose normal. No mucosal edema or rhinorrhea.  Mouth/Throat: Oropharynx is clear and moist and mucous membranes are normal. No dental abscesses or uvula swelling.  Eyes: Conjunctivae and EOM are normal. Pupils are equal, round, and reactive to light.  Neck: Normal range of motion and full passive range of motion without pain. Neck supple.    Cardiovascular: Normal rate, regular rhythm and normal heart sounds.  Exam reveals no gallop and no friction rub.   No murmur heard. Pulmonary/Chest: Effort normal and breath sounds normal. No respiratory distress. She has no wheezes. She has no rhonchi. She has no rales. She exhibits no tenderness and no crepitus.  Abdominal: Soft. Normal appearance and bowel sounds are normal. She exhibits no distension. There is tenderness in the left upper quadrant. There is no rebound and no guarding.    Genitourinary:  Genitourinary Comments: Positve L CVAT  Musculoskeletal: Normal range of motion. She exhibits no edema or tenderness.       Back:  Moves all extremities well.   Neurological:  She is alert and oriented to person, place, and time. She has normal strength. No cranial nerve deficit.  Skin: Skin is warm, dry and intact. No rash noted. No erythema. No pallor.  Pt's face is flushed.   Psychiatric: She has a normal mood and affect. Her speech is normal and behavior is normal. Her mood appears not anxious.  Nursing note and vitals reviewed.    ED Treatments / Results   DIAGNOSTIC STUDIES: Oxygen Saturation is 100% on RA, normal by my interpretation.      Labs (all labs ordered are listed, but only abnormal results are displayed) Results for orders placed or performed during the hospital encounter of 11/28/16  Pregnancy, urine  Result Value Ref Range   Preg Test, Ur NEGATIVE NEGATIVE  Urinalysis, Routine w reflex microscopic  Result Value Ref Range   Color, Urine YELLOW YELLOW   APPearance HAZY (A) CLEAR   Specific Gravity, Urine 1.021 1.005 - 1.030   pH 6.0 5.0 - 8.0   Glucose, UA NEGATIVE NEGATIVE mg/dL   Hgb urine dipstick SMALL (A) NEGATIVE   Bilirubin Urine NEGATIVE NEGATIVE   Ketones, ur NEGATIVE NEGATIVE mg/dL   Protein, ur NEGATIVE NEGATIVE mg/dL   Nitrite NEGATIVE NEGATIVE   Leukocytes, UA TRACE (A) NEGATIVE   RBC / HPF 6-30 0 - 5 RBC/hpf   WBC, UA 6-30 0 - 5  WBC/hpf   Bacteria, UA RARE (A) NONE SEEN   Squamous Epithelial / LPF 0-5 (A) NONE SEEN   Laboratory interpretation all normal except improving UTI (was TNTC WBC)   EKG  EKG Interpretation None       Radiology Ct Renal Stone Study  Result Date: 11/29/2016 CLINICAL DATA:  Left flank pain EXAM: CT ABDOMEN AND PELVIS WITHOUT CONTRAST TECHNIQUE: Multidetector CT imaging of the abdomen and pelvis was performed following the standard protocol without IV contrast. COMPARISON:  None. FINDINGS: Lower chest: No acute abnormality. Hepatobiliary: No focal liver abnormality is seen. No gallstones, gallbladder wall thickening, or biliary dilatation. Pancreas: Unremarkable. No pancreatic ductal dilatation or surrounding inflammatory changes. Spleen: Normal in size without focal abnormality. Adrenals/Urinary Tract: Adrenal glands are within normal limits. Punctate stones are present within both kidneys. Moderate left hydronephrosis and hydroureter, secondary to a 5 mm stone in the distal left ureter at the UVJ. The urinary bladder is empty. Stomach/Bowel: Stomach is within normal limits. Appendix appears normal. No evidence of bowel wall thickening, distention, or inflammatory changes. Vascular/Lymphatic: No significant vascular findings are present. No enlarged abdominal or pelvic lymph nodes. Reproductive: Uterus and bilateral adnexa are unremarkable. Other: Tiny fat in the umbilicus.  No free air or free fluid. Musculoskeletal: No acute or significant osseous findings. IMPRESSION: 1. Moderate left hydronephrosis and hydroureter secondary to a 5 mm stone in the distal left ureter at the UVJ. 2. Punctate stones present within both kidneys. Electronically Signed   By: Jasmine Pang M.D.   On: 11/29/2016 00:57    Procedures Procedures (including critical care time)  Medications Ordered in ED Medications  ketorolac (TORADOL) 30 MG/ML injection 30 mg (30 mg Intravenous Given 11/29/16 0034)  ondansetron  (ZOFRAN) injection 4 mg (4 mg Intravenous Given 11/29/16 0033)  ciprofloxacin (CIPRO) tablet 500 mg (500 mg Oral Given 11/29/16 0202)     Initial Impression / Assessment and Plan / ED Course  I have reviewed the triage vital signs and the nursing notes.  Pertinent labs & imaging results that were available during my care of the patient were reviewed by  me and considered in my medical decision making (see chart for details).  COORDINATION OF CARE: 12:11 AM-Discussed next steps with pt. Pt verbalized understanding and is agreeable with the plan. CT scan of the kidneys was done to rule out kidney stone. Patient's pain is very suspicious for that tonight. She was given IV Toradol for pain.  Recheck at 1:45 AM patient's pain is much improved. She is sitting quietly on her stretcher. Her face is less flushed. We discussed her test results. We talked about cutting out the caffeine in her diet and also the amount of milk she drinks. She was sent home with pain medication and she was continued on antibiotics however I switched to Cipro. She was advised to return to the ED for fever or uncontrolled vomiting or pain. Otherwise she can follow-up with urology this coming week.  Final Clinical Impressions(s) / ED Diagnoses   Final diagnoses:  Left ureteral stone  Acute cystitis without hematuria    New Prescriptions New Prescriptions   CIPROFLOXACIN (CIPRO) 500 MG TABLET    Take 1 tablet (500 mg total) by mouth 2 (two) times daily.   ONDANSETRON (ZOFRAN) 4 MG TABLET    Take 1 tablet (4 mg total) by mouth every 8 (eight) hours as needed for nausea or vomiting.   OXYCODONE-ACETAMINOPHEN (PERCOCET/ROXICET) 5-325 MG TABLET    Take 1 tablet by mouth every 4 (four) hours as needed for severe pain.   OXYCODONE-ACETAMINOPHEN (PERCOCET/ROXICET) 5-325 MG TABLET    Take 1 tablet by mouth every 6 (six) hours as needed for severe pain.   TAMSULOSIN (FLOMAX) 0.4 MG CAPS CAPSULE    Take 1 po QD until you pass the  stone.   Plan discharge  Devoria Albe, MD, FACEP  I personally performed the services described in this documentation, which was scribed in my presence. The recorded information has been reviewed and considered.  Devoria Albe, MD, Concha Pyo, MD 11/29/16 [phone]

## 2016-11-29 NOTE — ED Notes (Signed)
Patient transported to CT 

## 2016-11-30 MED FILL — Oxycodone w/ Acetaminophen Tab 5-325 MG: ORAL | Qty: 6 | Status: AC

## 2017-02-01 ENCOUNTER — Other Ambulatory Visit: Payer: Self-pay | Admitting: Urology

## 2017-02-01 NOTE — Patient Instructions (Signed)
Andrea Rangel  02/01/2017     @PREFPERIOPPHARMACY @   Your procedure is scheduled on  02/05/2017   Report to [facility] at  910  A.M.  Call this number if you have problems the morning of surgery:  [phone]   Remember:  Do not eat food or drink liquids after midnight.  Take these medicines the morning of surgery with A SIP OF WATER  Levothyroxine, zofran, oxycodone, flomax.   Do not wear jewelry, make-up or nail polish.  Do not wear lotions, powders, or perfumes, or deoderant.  Do not shave 48 hours prior to surgery.  Men may shave face and neck.  Do not bring valuables to the hospital.  [facility] is not responsible for any belongings or valuables.  Contacts, dentures or bridgework may not be worn into surgery.  Leave your suitcase in the car.  After surgery it may be brought to your room.  For patients admitted to the hospital, discharge time will be determined by your treatment team.  Patients discharged the day of surgery will not be allowed to drive home.   Name and phone number of your driver:   family Special instructions:  None  Please read over the following fact sheets that you were given. Anesthesia Post-op Instructions and Care and Recovery After Surgery       Ureteroscopy Ureteroscopy is a procedure to check for and treat problems inside part of the urinary tract. In this procedure, a thin, tube-shaped instrument with a light at the end (ureteroscope) is used to look at the inside of the kidneys and the ureters, which are the tubes that carry urine from the kidneys to the bladder. The ureteroscope is inserted into one or both of the ureters. You may need this procedure if you have frequent urinary tract infections (UTIs), blood in your urine, or a stone in one of your ureters. A ureteroscopy can be done to find the cause of urine blockage in a ureter and to evaluate other abnormalities inside the ureters or kidneys. If stones are  found, they can be removed during the procedure. Polyps, abnormal tissue, and some types of tumors can also be removed or treated. The ureteroscope may also have a tool to remove tissue to be checked for disease under a microscope (biopsy). Tell a health care provider about:  Any allergies you have.  All medicines you are taking, including vitamins, herbs, eye drops, creams, and over-the-counter medicines.  Any problems you or family members have had with anesthetic medicines.  Any blood disorders you have.  Any surgeries you have had.  Any medical conditions you have.  Whether you are pregnant or may be pregnant. What are the risks? Generally, this is a safe procedure. However, problems may occur, including:  Bleeding.  Infection.  Allergic reactions to medicines.  Scarring that narrows the ureter (stricture).  Creating a hole in the ureter (perforation).  What happens before the procedure? Staying hydrated Follow instructions from your health care provider about hydration, which may include:  Up to 2 hours before the procedure - you may continue to drink clear liquids, such as water, clear fruit juice, black coffee, and plain tea.  Eating and drinking restrictions Follow instructions from your health care provider about eating and drinking, which may include:  8 hours before the procedure - stop eating heavy meals or foods such as meat, fried foods, or fatty foods.  6 hours before the procedure - stop eating light meals or foods, such as toast or cereal.  6 hours before the procedure - stop drinking milk or drinks that contain milk.  2 hours before the procedure - stop drinking clear liquids.  Medicines  Ask your health care provider about: ? Changing or stopping your regular medicines. This is especially important if you are taking diabetes medicines or blood thinners. ? Taking medicines such as aspirin and ibuprofen. These medicines can thin your blood. Do not  take these medicines before your procedure if your health care provider instructs you not to.  You may be given antibiotic medicine to help prevent infection. General instructions  You may have a urine sample taken to check for infection.  Plan to have someone take you home from the hospital or clinic. What happens during the procedure?  To reduce your risk of infection: ? Your health care team will wash or sanitize their hands. ? Your skin will be washed with soap.  An IV tube will be inserted into one of your veins.  You will be given one of the following: ? A medicine to help you relax (sedative). ? A medicine to make you fall asleep (general anesthetic). ? A medicine that is injected into your spine to numb the area below and slightly above the injection site (spinal anesthetic).  To lower your risk of infection, you may be given an antibiotic medicine by an injection or through the IV tube.  The opening from which you urinate (urethra) will be cleaned with a germ-killing solution.  The ureteroscope will be passed through your urethra into your bladder.  A salt-water solution will flow through the ureteroscope to fill your bladder. This will help the health care provider see the openings of your ureters more clearly.  Then, the ureteroscope will be passed into your ureter. ? If a growth is found, a piece of it may be removed so it can be examined under a microscope (biopsy). ? If a stone is found, it may be removed through the ureteroscope, or the stone may be broken up using a laser, shock waves, or electrical energy. ? In some cases, if the ureter is too small, a tube may be inserted that keeps the ureter open (ureteral stent). The stent may be left in place for 1 or 2 weeks to keep the ureter open, and then the ureteroscopy procedure will be performed.  The scope will be removed, and your bladder will be emptied. The procedure may vary among health care providers and  hospitals. What happens after the procedure?  Your blood pressure, heart rate, breathing rate, and blood oxygen level will be monitored until the medicines you were given have worn off.  You may be asked to urinate.  Donot drive for 24 hours if you were given a sedative. This information is not intended to replace advice given to you by your health care provider. Make sure you discuss any questions you have with your health care provider. Document Released: 08/01/2013 Document Revised: 05/12/2016 Document Reviewed: 05/08/2016 Elsevier Interactive Patient Education  2018 [employer].  Ureteroscopy, Care After This sheet gives you information about how to care for yourself after your procedure. Your health care provider may also give you more specific instructions. If you have problems or questions, contact your health care provider. What can I expect after the procedure? After the procedure, it is common to have:  A burning sensation when you urinate.  Blood in  your urine.  Mild discomfort in the bladder area or kidney area when urinating.  Needing to urinate more often or urgently.  Follow these instructions at home: Medicines  Take over-the-counter and prescription medicines only as told by your health care provider.  If you were prescribed an antibiotic medicine, take it as told by your health care provider. Do not stop taking the antibiotic even if you start to feel better. General instructions   Donot drive for 24 hours if you were given a medicine to help you relax (sedative) during your procedure.  To relieve burning, try taking a warm bath or holding a warm washcloth over your groin.  Drink enough fluid to keep your urine clear or pale yellow. ? Drink two 8-ounce glasses of water every hour for the first 2 hours after you get home. ? Continue to drink water often at home.  You can eat what you usually do.  Keep all follow-up visits as told by your health care  provider. This is important. ? If you had a tube placed to keep urine flowing (ureteral stent), ask your health care provider when you need to return to have it removed. Contact a health care provider if:  You have chills or a fever.  You have burning pain for longer than 24 hours after the procedure.  You have blood in your urine for longer than 24 hours after the procedure. Get help right away if:  You have large amounts of blood in your urine.  You have blood clots in your urine.  You have very bad pain.  You have chest pain or trouble breathing.  You are unable to urinate and you have the feeling of a full bladder. This information is not intended to replace advice given to you by your health care provider. Make sure you discuss any questions you have with your health care provider. Document Released: 08/01/2013 Document Revised: 05/12/2016 Document Reviewed: 05/08/2016 Elsevier Interactive Patient Education  [employer].  Cystoscopy Cystoscopy is a procedure that is used to help diagnose and sometimes treat conditions that affect that lower urinary tract. The lower urinary tract includes the bladder and the tube that drains urine from the bladder out of the body (urethra). Cystoscopy is performed with a thin, tube-shaped instrument with a light and camera at the end (cystoscope). The cystoscope may be hard (rigid) or flexible, depending on the goal of the procedure.The cystoscope is inserted through the urethra, into the bladder. Cystoscopy may be recommended if you have:  Urinary tractinfections that keep coming back (recurring).  Blood in the urine (hematuria).  Loss of bladder control (urinary incontinence) or an overactive bladder.  Unusual cells found in a urine sample.  A blockage in the urethra.  Painful urination.  An abnormality in the bladder found during an intravenous pyelogram (IVP) or CT scan.  Cystoscopy may also be done to remove a sample of  tissue to be examined under a microscope (biopsy). Tell a health care provider about:  Any allergies you have.  All medicines you are taking, including vitamins, herbs, eye drops, creams, and over-the-counter medicines.  Any problems you or family members have had with anesthetic medicines.  Any blood disorders you have.  Any surgeries you have had.  Any medical conditions you have.  Whether you are pregnant or may be pregnant. What are the risks? Generally, this is a safe procedure. However, problems may occur, including:  Infection.  Bleeding.  Allergic reactions to medicines.  Damage  to other structures or organs.  What happens before the procedure?  Ask your health care provider about: ? Changing or stopping your regular medicines. This is especially important if you are taking diabetes medicines or blood thinners. ? Taking medicines such as aspirin and ibuprofen. These medicines can thin your blood. Do not take these medicines before your procedure if your health care provider instructs you not to.  Follow instructions from your health care provider about eating or drinking restrictions.  You may be given antibiotic medicine to help prevent infection.  You may have an exam or testing, such as X-rays of the bladder, urethra, or kidneys.  You may have urine tests to check for signs of infection.  Plan to have someone take you home after the procedure. What happens during the procedure?  To reduce your risk of infection,your health care team will wash or sanitize their hands.  You will be given one or more of the following: ? A medicine to help you relax (sedative). ? A medicine to numb the area (local anesthetic).  The area around the opening of your urethra will be cleaned.  The cystoscope will be passed through your urethra into your bladder.  Germ-free (sterile)fluid will flow through the cystoscope to fill your bladder. The fluid will stretch your  bladder so that your surgeon can clearly examine your bladder walls.  The cystoscope will be removed and your bladder will be emptied. The procedure may vary among health care providers and hospitals. What happens after the procedure?  You may have some soreness or pain in your abdomen and urethra. Medicines will be available to help you.  You may have some blood in your urine.  Do not drive for 24 hours if you received a sedative. This information is not intended to replace advice given to you by your health care provider. Make sure you discuss any questions you have with your health care provider. Document Released: 07/24/2000 Document Revised: 12/05/2015 Document Reviewed: 06/13/2015 Elsevier Interactive Patient Education  2017 [employer] Inc[employer]  Cystoscopy, Care After Refer to this sheet in the next few weeks. These instructions provide you with information about caring for yourself after your procedure. Your health care provider may also give you more specific instructions. Your treatment has been planned according to current medical practices, but problems sometimes occur. Call your health care provider if you have any problems or questions after your procedure. What can I expect after the procedure? After the procedure, it is common to have:  Mild pain when you urinate. Pain should stop within a few minutes after you urinate. This may last for up to 1 week.  A small amount of blood in your urine for several days.  Feeling like you need to urinate but producing only a small amount of urine.  Follow these instructions at home:  Medicines  Take over-the-counter and prescription medicines only as told by your health care provider.  If you were prescribed an antibiotic medicine, take it as told by your health care provider. Do not stop taking the antibiotic even if you start to feel better. General instructions   Return to your normal activities as told by your health care  provider. Ask your health care provider what activities are safe for you.  Do not drive for 24 hours if you received a sedative.  Watch for any blood in your urine. If the amount of blood in your urine increases, call your health care provider.  Follow instructions from your  health care provider about eating or drinking restrictions.  If a tissue sample was removed for testing (biopsy) during your procedure, it is your responsibility to get your test results. Ask your health care provider or the department performing the test when your results will be ready.  Drink enough fluid to keep your urine clear or pale yellow.  Keep all follow-up visits as told by your health care provider. This is important. Contact a health care provider if:  You have pain that gets worse or does not get better with medicine, especially pain when you urinate.  You have difficulty urinating. Get help right away if:  You have more blood in your urine.  You have blood clots in your urine.  You have abdominal pain.  You have a fever or chills.  You are unable to urinate. This information is not intended to replace advice given to you by your health care provider. Make sure you discuss any questions you have with your health care provider. Document Released: 02/13/2005 Document Revised: 01/02/2016 Document Reviewed: 06/13/2015 Elsevier Interactive Patient Education  2017 Elsevier Inc.  General Anesthesia, Adult General anesthesia is the use of medicines to make a person "go to sleep" (be unconscious) for a medical procedure. General anesthesia is often recommended when a procedure:  Is long.  Requires you to be still or in an unusual position.  Is major and can cause you to lose blood.  Is impossible to do without general anesthesia.  The medicines used for general anesthesia are called general anesthetics. In addition to making you sleep, the medicines:  Prevent pain.  Control your blood  pressure.  Relax your muscles.  Tell a health care provider about:  Any allergies you have.  All medicines you are taking, including vitamins, herbs, eye drops, creams, and over-the-counter medicines.  Any problems you or family members have had with anesthetic medicines.  Types of anesthetics you have had in the past.  Any bleeding disorders you have.  Any surgeries you have had.  Any medical conditions you have.  Any history of heart or lung conditions, such as heart failure, sleep apnea, or chronic obstructive pulmonary disease (COPD).  Whether you are pregnant or may be pregnant.  Whether you use tobacco, alcohol, marijuana, or street drugs.  Any history of Financial [employer].  Any history of depression or anxiety. What are the risks? Generally, this is a safe procedure. However, problems may occur, including:  Allergic reaction to anesthetics.  Lung and heart problems.  Inhaling food or liquids from your stomach into your lungs (aspiration).  Injury to nerves.  Waking up during your procedure and being unable to move (rare).  Extreme agitation or a state of mental confusion (delirium) when you wake up from the anesthetic.  Air in the bloodstream, which can lead to stroke.  These problems are more likely to develop if you are having a major surgery or if you have an advanced medical condition. You can prevent some of these complications by answering all of your health care provider's questions thoroughly and by following all pre-procedure instructions. General anesthesia can cause side effects, including:  Nausea or vomiting  A sore throat from the breathing tube.  Feeling cold or shivery.  Feeling tired, washed out, or achy.  Sleepiness or drowsiness.  Confusion or agitation.  What happens before the procedure? Staying hydrated Follow instructions from your health care provider about hydration, which may include:  Up to 2 hours before the procedure  - you  may continue to drink clear liquids, such as water, clear fruit juice, black coffee, and plain tea.  Eating and drinking restrictions Follow instructions from your health care provider about eating and drinking, which may include:  8 hours before the procedure - stop eating heavy meals or foods such as meat, fried foods, or fatty foods.  6 hours before the procedure - stop eating light meals or foods, such as toast or cereal.  6 hours before the procedure - stop drinking milk or drinks that contain milk.  2 hours before the procedure - stop drinking clear liquids.  Medicines  Ask your health care provider about: ? Changing or stopping your regular medicines. This is especially important if you are taking diabetes medicines or blood thinners. ? Taking medicines such as aspirin and ibuprofen. These medicines can thin your blood. Do not take these medicines before your procedure if your health care provider instructs you not to. ? Taking new dietary supplements or medicines. Do not take these during the week before your procedure unless your health care provider approves them.  If you are told to take a medicine or to continue taking a medicine on the day of the procedure, take the medicine with sips of water. General instructions   Ask if you will be going home the same day, the following day, or after a longer hospital stay. ? Plan to have someone take you home. ? Plan to have someone stay with you for the first 24 hours after you leave the hospital or clinic.  For 3-6 weeks before the procedure, try not to use any tobacco products, such as cigarettes, chewing tobacco, and e-cigarettes.  You may brush your teeth on the morning of the procedure, but make sure to spit out the toothpaste. What happens during the procedure?  You will be given anesthetics through a mask and through an IV tube in one of your veins.  You may receive medicine to help you relax (sedative).  As soon  as you are asleep, a breathing tube may be used to help you breathe.  An anesthesia specialist will stay with you throughout the procedure. He or she will help keep you comfortable and safe by continuing to give you medicines and adjusting the amount of medicine that you get. He or she will also watch your blood pressure, pulse, and oxygen levels to make sure that the anesthetics do not cause any problems.  If a breathing tube was used to help you breathe, it will be removed before you wake up. The procedure may vary among health care providers and hospitals. What happens after the procedure?  You will wake up, often slowly, after the procedure is complete, usually in a recovery area.  Your blood pressure, heart rate, breathing rate, and blood oxygen level will be monitored until the medicines you were given have worn off.  You may be given medicine to help you calm down if you feel anxious or agitated.  If you will be going home the same day, your health care provider may check to make sure you can stand, drink, and urinate.  Your health care providers will treat your pain and side effects before you go home.  Do not drive for 24 hours if you received a sedative.  You may: ? Feel nauseous and vomit. ? Have a sore throat. ? Have mental slowness. ? Feel cold or shivery. ? Feel sleepy. ? Feel tired. ? Feel sore or achy, even in parts of your  body where you did not have surgery. This information is not intended to replace advice given to you by your health care provider. Make sure you discuss any questions you have with your health care provider. Document Released: 11/03/2007 Document Revised: 01/07/2016 Document Reviewed: 07/11/2015 Elsevier Interactive Patient Education  2018 [employer]. General Anesthesia, Adult, Care After These instructions provide you with information about caring for yourself after your procedure. Your health care provider may also give you more specific  instructions. Your treatment has been planned according to current medical practices, but problems sometimes occur. Call your health care provider if you have any problems or questions after your procedure. What can I expect after the procedure? After the procedure, it is common to have:  Vomiting.  A sore throat.  Mental slowness.  It is common to feel:  Nauseous.  Cold or shivery.  Sleepy.  Tired.  Sore or achy, even in parts of your body where you did not have surgery.  Follow these instructions at home: For at least 24 hours after the procedure:  Do not: ? Participate in activities where you could fall or become injured. ? Drive. ? Use heavy machinery. ? Drink alcohol. ? Take sleeping pills or medicines that cause drowsiness. ? Make important decisions or sign legal documents. ? Take care of children on your own.  Rest. Eating and drinking  If you vomit, drink water, juice, or soup when you can drink without vomiting.  Drink enough fluid to keep your urine clear or pale yellow.  Make sure you have little or no nausea before eating solid foods.  Follow the diet recommended by your health care provider. General instructions  Have a responsible adult stay with you until you are awake and alert.  Return to your normal activities as told by your health care provider. Ask your health care provider what activities are safe for you.  Take over-the-counter and prescription medicines only as told by your health care provider.  If you smoke, do not smoke without supervision.  Keep all follow-up visits as told by your health care provider. This is important. Contact a health care provider if:  You continue to have nausea or vomiting at home, and medicines are not helpful.  You cannot drink fluids or start eating again.  You cannot urinate after 8-12 hours.  You develop a skin rash.  You have fever.  You have increasing redness at the site of your  procedure. Get help right away if:  You have difficulty breathing.  You have chest pain.  You have unexpected bleeding.  You feel that you are having a life-threatening or urgent problem. This information is not intended to replace advice given to you by your health care provider. Make sure you discuss any questions you have with your health care provider. Document Released: 11/02/2000 Document Revised: 12/30/2015 Document Reviewed: 07/11/2015 Elsevier Interactive Patient Education  [employer].

## 2017-02-04 ENCOUNTER — Encounter (HOSPITAL_COMMUNITY)
Admission: RE | Admit: 2017-02-04 | Discharge: 2017-02-04 | Disposition: A | Discharge status: Home / Self Care | Payer: 59 | Source: Ambulatory Visit | Attending: Urology | Admitting: Urology

## 2017-02-04 ENCOUNTER — Encounter (HOSPITAL_COMMUNITY): Payer: Self-pay

## 2017-02-04 DIAGNOSIS — Z79899 Other long term (current) drug therapy: Secondary | ICD-10-CM | POA: Diagnosis not present

## 2017-02-04 DIAGNOSIS — Z9889 Other specified postprocedural states: Secondary | ICD-10-CM | POA: Diagnosis not present

## 2017-02-04 DIAGNOSIS — Z8679 Personal history of other diseases of the circulatory system: Secondary | ICD-10-CM | POA: Diagnosis not present

## 2017-02-04 DIAGNOSIS — Z87442 Personal history of urinary calculi: Secondary | ICD-10-CM | POA: Diagnosis not present

## 2017-02-04 DIAGNOSIS — N201 Calculus of ureter: Secondary | ICD-10-CM | POA: Diagnosis present

## 2017-02-04 DIAGNOSIS — Z8249 Family history of ischemic heart disease and other diseases of the circulatory system: Secondary | ICD-10-CM | POA: Diagnosis not present

## 2017-02-04 DIAGNOSIS — Z803 Family history of malignant neoplasm of breast: Secondary | ICD-10-CM | POA: Diagnosis not present

## 2017-02-04 DIAGNOSIS — E0590 Thyrotoxicosis, unspecified without thyrotoxic crisis or storm: Secondary | ICD-10-CM | POA: Diagnosis not present

## 2017-02-04 HISTORY — DX: Anemia, unspecified: D64.9

## 2017-02-04 HISTORY — DX: Personal history of urinary calculi: Z87.442

## 2017-02-04 LAB — CBC WITH DIFFERENTIAL/PLATELET
Basophils Absolute: 0 10*3/uL (ref 0.0–0.1)
Basophils Relative: 0 %
EOS PCT: 0 %
Eosinophils Absolute: 0 10*3/uL (ref 0.0–0.7)
HEMATOCRIT: 38.3 % (ref 36.0–46.0)
HEMOGLOBIN: 12.1 g/dL (ref 12.0–15.0)
LYMPHS ABS: 1.5 10*3/uL (ref 0.7–4.0)
Lymphocytes Relative: 25 %
MCH: 24.1 pg — AB (ref 26.0–34.0)
MCHC: 31.6 g/dL (ref 30.0–36.0)
MCV: 76.1 fL — ABNORMAL LOW (ref 78.0–100.0)
Monocytes Absolute: 0.3 10*3/uL (ref 0.1–1.0)
Monocytes Relative: 4 %
NEUTROS ABS: 4.2 10*3/uL (ref 1.7–7.7)
Neutrophils Relative %: 71 %
Platelets: 343 10*3/uL (ref 150–400)
RBC: 5.03 MIL/uL (ref 3.87–5.11)
RDW: 15.5 % (ref 11.5–15.5)
WBC: 6 10*3/uL (ref 4.0–10.5)

## 2017-02-04 LAB — BASIC METABOLIC PANEL
ANION GAP: 9 (ref 5–15)
BUN: 13 mg/dL (ref 6–20)
CHLORIDE: 103 mmol/L (ref 101–111)
CO2: 25 mmol/L (ref 22–32)
Calcium: 9.3 mg/dL (ref 8.9–10.3)
Creatinine, Ser: 0.68 mg/dL (ref 0.44–1.00)
GFR calc Af Amer: >60 mL/min (ref >60)
GFR calc non Af Amer: >60 mL/min (ref >60)
GLUCOSE: 93 mg/dL (ref 65–99)
POTASSIUM: 3.7 mmol/L (ref 3.5–5.1)
Sodium: 137 mmol/L (ref 135–145)

## 2017-02-04 LAB — HCG, SERUM, QUALITATIVE: Preg, Serum: NEGATIVE

## 2017-02-05 ENCOUNTER — Ambulatory Visit (HOSPITAL_COMMUNITY): Payer: 59

## 2017-02-05 ENCOUNTER — Ambulatory Visit (HOSPITAL_COMMUNITY)
Admission: RE | Admit: 2017-02-05 | Discharge: 2017-02-05 | Disposition: A | Discharge status: Home / Self Care | Payer: 59 | Source: Ambulatory Visit | Attending: Urology | Admitting: Urology

## 2017-02-05 ENCOUNTER — Encounter (HOSPITAL_COMMUNITY)
Admission: RE | Disposition: A | Discharge status: Home / Self Care | Payer: Self-pay | Source: Ambulatory Visit | Attending: Urology

## 2017-02-05 ENCOUNTER — Ambulatory Visit (HOSPITAL_COMMUNITY): Payer: 59 | Admitting: Anesthesiology

## 2017-02-05 ENCOUNTER — Encounter (HOSPITAL_COMMUNITY): Payer: Self-pay | Admitting: *Deleted

## 2017-02-05 DIAGNOSIS — Z8679 Personal history of other diseases of the circulatory system: Secondary | ICD-10-CM | POA: Insufficient documentation

## 2017-02-05 DIAGNOSIS — N201 Calculus of ureter: Secondary | ICD-10-CM | POA: Insufficient documentation

## 2017-02-05 DIAGNOSIS — Z79899 Other long term (current) drug therapy: Secondary | ICD-10-CM | POA: Insufficient documentation

## 2017-02-05 DIAGNOSIS — Z8249 Family history of ischemic heart disease and other diseases of the circulatory system: Secondary | ICD-10-CM | POA: Insufficient documentation

## 2017-02-05 DIAGNOSIS — Z803 Family history of malignant neoplasm of breast: Secondary | ICD-10-CM | POA: Insufficient documentation

## 2017-02-05 DIAGNOSIS — Z9889 Other specified postprocedural states: Secondary | ICD-10-CM | POA: Insufficient documentation

## 2017-02-05 DIAGNOSIS — E0590 Thyrotoxicosis, unspecified without thyrotoxic crisis or storm: Secondary | ICD-10-CM | POA: Insufficient documentation

## 2017-02-05 DIAGNOSIS — Z87442 Personal history of urinary calculi: Secondary | ICD-10-CM | POA: Insufficient documentation

## 2017-02-05 HISTORY — PX: HOLMIUM LASER APPLICATION: SHX5852

## 2017-02-05 HISTORY — PX: CYSTOSCOPY WITH RETROGRADE PYELOGRAM, URETEROSCOPY AND STENT PLACEMENT: SHX5789

## 2017-02-05 SURGERY — CYSTOURETEROSCOPY, WITH RETROGRADE PYELOGRAM AND STENT INSERTION
Anesthesia: General | Laterality: Left

## 2017-02-05 MED ORDER — PROPOFOL 10 MG/ML IV BOLUS
INTRAVENOUS | Status: AC
  Filled 2017-02-05: qty 20

## 2017-02-05 MED ORDER — MIDAZOLAM HCL 2 MG/2ML IJ SOLN
INTRAMUSCULAR | Status: AC
Start: 2017-02-05 — End: 2017-02-05
  Filled 2017-02-05: qty 2

## 2017-02-05 MED ORDER — FENTANYL CITRATE (PF) 100 MCG/2ML IJ SOLN
INTRAMUSCULAR | Status: AC
  Filled 2017-02-05: qty 2

## 2017-02-05 MED ORDER — DIATRIZOATE MEGLUMINE 30 % UR SOLN
URETHRAL | Status: DC | PRN
  Administered 2017-02-05: 20 mL via URETHRAL

## 2017-02-05 MED ORDER — LIDOCAINE HCL 1 % IJ SOLN
INTRAMUSCULAR | Status: DC | PRN
  Administered 2017-02-05: 25 mg via INTRADERMAL

## 2017-02-05 MED ORDER — FENTANYL CITRATE (PF) 100 MCG/2ML IJ SOLN
INTRAMUSCULAR | Status: DC | PRN
  Administered 2017-02-05: 25 ug via INTRAVENOUS

## 2017-02-05 MED ORDER — PROPOFOL 10 MG/ML IV BOLUS
INTRAVENOUS | Status: DC | PRN
  Administered 2017-02-05: 200 mg via INTRAVENOUS

## 2017-02-05 MED ORDER — OXYCODONE HCL 5 MG/5ML PO SOLN: 5.0000 mg | Freq: Once | ORAL | Status: DC | PRN

## 2017-02-05 MED ORDER — CEFAZOLIN SODIUM-DEXTROSE 2-4 GM/100ML-% IV SOLN
INTRAVENOUS | Status: AC
  Filled 2017-02-05: qty 100

## 2017-02-05 MED ORDER — MIDAZOLAM HCL 2 MG/2ML IJ SOLN
1.0000 mg | Freq: Once | INTRAMUSCULAR | Status: AC | PRN
  Administered 2017-02-05: 2 mg via INTRAVENOUS

## 2017-02-05 MED ORDER — DIATRIZOATE MEGLUMINE 30 % UR SOLN
URETHRAL | Status: AC
  Filled 2017-02-05: qty 300

## 2017-02-05 MED ORDER — MIDAZOLAM HCL 2 MG/2ML IJ SOLN
INTRAMUSCULAR | Status: AC
  Filled 2017-02-05: qty 2

## 2017-02-05 MED ORDER — MIDAZOLAM HCL 5 MG/5ML IJ SOLN
INTRAMUSCULAR | Status: DC | PRN
  Administered 2017-02-05: 2 mg via INTRAVENOUS

## 2017-02-05 MED ORDER — SULFAMETHOXAZOLE-TRIMETHOPRIM 800-160 MG PO TABS: 1.0000 | ORAL_TABLET | Freq: Two times a day (BID) | ORAL | 0 refills | Status: DC

## 2017-02-05 MED ORDER — OXYCODONE HCL 5 MG PO TABS: 5.0000 mg | ORAL_TABLET | Freq: Once | ORAL | Status: DC | PRN

## 2017-02-05 MED ORDER — LACTATED RINGERS IV SOLN
INTRAVENOUS | Status: DC
  Administered 2017-02-05: 11:00:00 via INTRAVENOUS

## 2017-02-05 MED ORDER — OXYCODONE-ACETAMINOPHEN 5-325 MG PO TABS: 1.0000 | ORAL_TABLET | Freq: Four times a day (QID) | ORAL | 0 refills | Status: DC | PRN

## 2017-02-05 MED ORDER — CEFAZOLIN SODIUM-DEXTROSE 2-4 GM/100ML-% IV SOLN
2.0000 g | INTRAVENOUS | Status: AC
  Administered 2017-02-05: 2 g via INTRAVENOUS

## 2017-02-05 MED ORDER — HYDROMORPHONE HCL 1 MG/ML IJ SOLN: 0.2500 mg | INTRAMUSCULAR | Status: DC | PRN

## 2017-02-05 SURGICAL SUPPLY — 27 items
BAG DRAIN CYSTO-URO STER (DRAIN) ×2 IMPLANT
BAG HAMPER (MISCELLANEOUS) ×2 IMPLANT
CATH INTERMIT  6FR 70CM (CATHETERS) ×2 IMPLANT
CLOTH BEACON ORANGE TIMEOUT ST (SAFETY) ×2 IMPLANT
DECANTER SPIKE VIAL GLASS SM (MISCELLANEOUS) ×2 IMPLANT
EXTRACTOR STONE NITINOL NGAGE (UROLOGICAL SUPPLIES) ×2 IMPLANT
FIBER LASER FLEXIVA 200 (UROLOGICAL SUPPLIES) ×2 IMPLANT
FIBER LASER FLEXIVA 365 (UROLOGICAL SUPPLIES) IMPLANT
GLOVE BIO SURGEON STRL SZ8 (GLOVE) ×2 IMPLANT
GLOVE BIOGEL PI IND STRL 7.0 (GLOVE) ×1 IMPLANT
GLOVE BIOGEL PI INDICATOR 7.0 (GLOVE) ×1
GOWN STRL REUS W/ TWL XL LVL3 (GOWN DISPOSABLE) ×1 IMPLANT
GOWN STRL REUS W/TWL LRG LVL3 (GOWN DISPOSABLE) ×2 IMPLANT
GOWN STRL REUS W/TWL XL LVL3 (GOWN DISPOSABLE) ×2
GUIDEWIRE ANG ZIPWIRE 038X150 (WIRE) ×2 IMPLANT
GUIDEWIRE STR DUAL SENSOR (WIRE) IMPLANT
GUIDEWIRE STR ZIPWIRE 035X150 (MISCELLANEOUS) ×2 IMPLANT
IV NS IRRIG 3000ML ARTHROMATIC (IV SOLUTION) ×4 IMPLANT
KIT ROOM TURNOVER AP CYSTO (KITS) ×2 IMPLANT
MANIFOLD NEPTUNE II (INSTRUMENTS) IMPLANT
PACK CYSTO (CUSTOM PROCEDURE TRAY) ×2 IMPLANT
PAD ARMBOARD 7.5X6 YLW CONV (MISCELLANEOUS) ×2 IMPLANT
STENT URET 6FRX24 CONTOUR (STENTS) ×2 IMPLANT
STENT URET 6FRX26 CONTOUR (STENTS) IMPLANT
SYRINGE 10CC LL (SYRINGE) ×2 IMPLANT
TOWEL OR 17X26 4PK STRL BLUE (TOWEL DISPOSABLE) ×2 IMPLANT
WATER STERILE IRR 1000ML POUR (IV SOLUTION) ×2 IMPLANT

## 2017-02-05 NOTE — Transfer of Care (Signed)
Immediate Anesthesia Transfer of Care Note  Patient: Andrea Rangel  Procedure(s) Performed: Procedure(s) with comments: CYSTOSCOPY WITH RETROGRADE PYELOGRAM, URETEROSCOPY AND STENT PLACEMENT (Left) - 1 HR [phone] [id number] HOLMIUM LASER APPLICATION (Left)  Patient Location: PACU  Anesthesia Type:General  Level of Consciousness: awake and patient cooperative  Airway & Oxygen Therapy: Patient Spontanous Breathing and Patient connected to face mask oxygen  Post-op Assessment: Report given to RN, Post -op Vital signs reviewed and stable and Patient moving all extremities  Post vital signs: Reviewed and stable  Last Vitals:  Vitals:   02/05/17 1130 02/05/17 1135  BP: 113/76 106/70  Resp: 18 (!) 22  Temp:      Last Pain:  Vitals:   02/05/17 1124  TempSrc: Oral         Complications: No apparent anesthesia complications

## 2017-02-05 NOTE — Anesthesia Preprocedure Evaluation (Addendum)
Anesthesia Evaluation  Patient identified by MRN, date of birth, ID band  Airway Mallampati: I  TM Distance: >3 FB     Dental  (+) Teeth Intact   Pulmonary    Pulmonary exam normal        Cardiovascular Exercise Tolerance: Good Normal cardiovascular exam+ dysrhythmias (resolved) Supra Ventricular Tachycardia  Rhythm:Regular Rate:Normal     Neuro/Psych    GI/Hepatic negative GI ROS, Neg liver ROS,   Endo/Other  negative endocrine ROS  Renal/GU Renal diseasenegative Renal ROSstones     Musculoskeletal   Abdominal Normal abdominal exam  (+)   Peds  Hematology negative hematology ROS (+)   Anesthesia Other Findings   Reproductive/Obstetrics                            Anesthesia Physical Anesthesia Plan  ASA: II  Anesthesia Plan: General   Post-op Pain Management:    Induction: Intravenous  PONV Risk Score and Plan:   Airway Management Planned: LMA  Additional Equipment:   Intra-op Plan:   Post-operative Plan: Extubation in OR  Informed Consent: I have reviewed the patients History and Physical, chart, labs and discussed the procedure including the risks, benefits and alternatives for the proposed anesthesia with the patient or authorized representative who has indicated his/her understanding and acceptance.     Plan Discussed with:   Anesthesia Plan Comments:         Anesthesia Quick Evaluation

## 2017-02-05 NOTE — Discharge Instructions (Signed)

## 2017-02-05 NOTE — H&P (Signed)
Urology Admission H&P  Chief Complaint: left flank pain  History of Present Illness: Andrea Rangel is a 26yo with a hx of left ureteral calculus who has failed MET  Past Medical History:  Diagnosis Date  . Anemia   . AVNRT (AV nodal re-entry tachycardia) ([place])    s/p successful ablation in 2012  . History of kidney stones   . Hyperthyroidism   . Pain in joint   . SVT (supraventricular tachycardia) ([facility])    AV nodal re-entry tachycardia (Dr. Raul Del) s/p ablation  . Tachycardia    Past Surgical History:  Procedure Laterality Date  . ABLATION     cardiac  . BLADDER SURGERY  2012   urethral dialtion  . CESAREAN SECTION N/A 04/01/2016   Procedure: CESAREAN SECTION;  Surgeon: Tyson Dense, MD;  Location: [place];  Service: Obstetrics;  Laterality: N/A;    Home Medications:  Prescriptions Prior to Admission  Medication Sig Dispense Refill Last Dose  . acetaminophen (TYLENOL) 500 MG tablet Take 1,000 mg by mouth every 6 (six) hours as needed for mild pain.   Past Week at Unknown time  . acetaminophen (TYLENOL) 500 MG tablet Take 1,000 mg by mouth every 6 (six) hours as needed for mild pain.   02/03/2017  . levothyroxine (SYNTHROID, LEVOTHROID) 125 MCG tablet Take 125 mcg by mouth daily before breakfast.   02/05/2017 at 0730  . oxyCODONE-acetaminophen (PERCOCET/ROXICET) 5-325 MG tablet Take 1 tablet by mouth every 6 (six) hours as needed for severe pain. 15 tablet 0 02/04/2017 at Unknown time  . cephALEXin (KEFLEX) 500 MG capsule Take 1 capsule (500 mg total) by mouth 4 (four) times daily. (Patient not taking: Reported on 02/01/2017) 28 capsule 0 Not Taking at Unknown time  . ciprofloxacin (CIPRO) 500 MG tablet Take 1 tablet (500 mg total) by mouth 2 (two) times daily. (Patient not taking: Reported on 02/01/2017) 14 tablet 0 Not Taking at Unknown time  . ibuprofen (ADVIL,MOTRIN) 600 MG tablet Take 1 tablet (600 mg total) by mouth every 6 (six) hours. (Patient not taking: Reported  on 02/01/2017) 30 tablet 1 Not Taking at Unknown time  . ondansetron (ZOFRAN) 4 MG tablet Take 1 tablet (4 mg total) by mouth every 8 (eight) hours as needed for nausea or vomiting. 10 tablet 0 More than a month at Unknown time  . oxyCODONE-acetaminophen (PERCOCET/ROXICET) 5-325 MG tablet Take 1 tablet by mouth every 4 (four) hours as needed for severe pain. (Patient not taking: Reported on 02/01/2017) 6 tablet 0 Not Taking at Unknown time  . tamsulosin (FLOMAX) 0.4 MG CAPS capsule Take 1 po QD until you pass the stone. (Patient not taking: Reported on 02/01/2017) 10 capsule 0 Not Taking at Unknown time   Allergies: No Known Allergies  Family History  Problem Relation Age of Onset  . Breast cancer Maternal Grandmother 31  . Heart Problems Maternal Grandfather 72   Social History:  reports that she has never smoked. She has never used smokeless tobacco. She reports that she does not drink alcohol or use drugs.  Review of Systems  Genitourinary: Positive for flank pain.  All other systems reviewed and are negative.   Physical Exam:  Vital signs in last 24 hours:   Physical Exam  Constitutional: She is oriented to person, place, and time. She appears well-developed and well-nourished.  HENT:  Head: Normocephalic and atraumatic.  Eyes: EOM are normal. Pupils are equal, round, and reactive to light.  Neck: Normal range of motion.  No thyromegaly present.  Cardiovascular: Normal rate and regular rhythm.   Respiratory: Effort normal. No respiratory distress.  GI: Soft. She exhibits no distension.  Musculoskeletal: Normal range of motion. She exhibits no edema.  Neurological: She is alert and oriented to person, place, and time.  Skin: Skin is warm and dry.  Psychiatric: She has a normal mood and affect. Her behavior is normal. Judgment and thought content normal.    Laboratory Data:  No results found for this or any previous visit (from the past 24 hour(s)). No results found for this or  any previous visit (from the past 240 hour(s)). Creatinine:  Recent Labs  02/04/17 0813  CREATININE 0.68   Baseline Creatinine: 0.7  Impression/Assessment:  26yo with left UVJ calculus  Plan:  The risks/benefits/alternatives to L URS was explained to the patient and she understands and wishes to proceed with surgery  Nicolette Bang 02/05/2017, 10:53 AM

## 2017-02-05 NOTE — Op Note (Signed)
.  Preoperative diagnosis: Left ureteral stone  Postoperative diagnosis: Same  Procedure: 1 cystoscopy 2. Left retrograde pyelography 3.  Intraoperative fluoroscopy, under one hour, with interpretation 4.  Left ureteroscopic stone manipulation with laser lithotripsy 5.  Left 6 x 26 JJ stent placement  Attending: Cleda MccreedyPatrick Mackenzie  Anesthesia: General  Estimated blood loss: None  Drains: Left 6 x 26 JJ ureteral stent with tether  Specimens: stone for analysis  Antibiotics: ancef  Findings: left distal ureteral stone. Moderate hydronephrosis. No masses/lesions in the bladder. Ureteral orifices in normal anatomic location.  Indications: Patient is a 26 year old female with a history of left ureteral calculus and who has persistent left flank pain.  After discussing treatment options, she decided proceed with left ureteroscopic stone manipulation.  Procedure her in detail: The patient was brought to the operating room and a brief timeout was done to ensure correct patient, correct procedure, correct site.  General anesthesia was administered patient was placed in dorsal lithotomy position.  Her genitalia was then prepped and draped in usual sterile fashion.  A rigid 22 French cystoscope was passed in the urethra and the bladder.  Bladder was inspected free masses or lesions.  the ureteral orifices were in the normal orthotopic locations.  a 6 french ureteral catheter was then instilled into the left ureteral orifice.  a gentle retrograde was obtained and findings noted above.  we then placed a zip wire through the ureteral catheter and advanced up to the renal pelvis.  we then removed the cystoscope and cannulated the left ureteral orifice with a semirigid ureteroscope.  We encountered a stone in the distal ureter, using a 200 nm laser fiber and fragmented the stone into smaller pieces.  the pieces were then removed with a engage basket.  Once all stone fragments were removed we then placed a 6 x  26 double-j ureteral stent over the original zip wire. We then removed the wire and good coil was noted in the the renal pelvis under fluoroscopy and the bladder under direct vision.  the stone fragments were then removed from the bladder and sent for analysis.   the bladder was then drained and this concluded the procedure which was well tolerated by patient.  Complications: None  Condition: Stable, extubated, transferred to PACU  Plan: Patient is to be discharged home as to follow-up in one week. She is to remove her stent by pulling the string in 72 hours

## 2017-02-05 NOTE — Anesthesia Postprocedure Evaluation (Signed)
Anesthesia Post Note  Patient: Andrea Rangel  Procedure(s) Performed: Procedure(s) (LRB): CYSTOSCOPY WITH RETROGRADE PYELOGRAM, URETEROSCOPY AND STENT PLACEMENT (Left) HOLMIUM LASER APPLICATION (Left)  Patient location during evaluation: PACU Anesthesia Type: General Level of consciousness: awake, oriented and patient cooperative Pain management: pain level controlled Vital Signs Assessment: post-procedure vital signs reviewed and stable Respiratory status: spontaneous breathing, nonlabored ventilation and respiratory function stable Cardiovascular status: blood pressure returned to baseline Postop Assessment: no signs of nausea or vomiting Anesthetic complications: no     Last Vitals:  Vitals:   02/05/17 1130 02/05/17 1135  BP: 113/76 106/70  Resp: 18 (!) 22  Temp:      Last Pain:  Vitals:   02/05/17 1124  TempSrc: Oral                 Tricha Ruggirello J

## 2017-02-05 NOTE — Anesthesia Procedure Notes (Signed)
Procedure Name: LMA Insertion Date/Time: 02/05/2017 11:48 AM Performed by: Despina HiddenIDACAVAGE, Semone Orlov J Pre-anesthesia Checklist: Patient identified, Patient being monitored, Emergency Drugs available, Timeout performed and Suction available Patient Re-evaluated:Patient Re-evaluated prior to inductionOxygen Delivery Method: Circle System Utilized Preoxygenation: Pre-oxygenation with 100% oxygen Intubation Type: IV induction Ventilation: Mask ventilation without difficulty LMA: LMA inserted LMA Size: 4.0 Number of attempts: 1 Placement Confirmation: positive ETCO2 and breath sounds checked- equal and bilateral

## 2017-02-08 ENCOUNTER — Encounter (HOSPITAL_COMMUNITY): Payer: Self-pay | Admitting: Urology

## 2017-02-12 ENCOUNTER — Other Ambulatory Visit: Payer: Self-pay | Admitting: Urology

## 2017-02-12 ENCOUNTER — Ambulatory Visit (INDEPENDENT_AMBULATORY_CARE_PROVIDER_SITE_OTHER): Payer: 59 | Admitting: Urology

## 2017-02-12 DIAGNOSIS — N201 Calculus of ureter: Secondary | ICD-10-CM | POA: Diagnosis not present

## 2017-02-12 DIAGNOSIS — N3010 Interstitial cystitis (chronic) without hematuria: Secondary | ICD-10-CM | POA: Diagnosis not present

## 2017-02-16 LAB — STONE ANALYSIS
Ca Oxalate,Dihydrate: 15 %
Ca Oxalate,Monohydr.: 20 %
Ca phos cry stone ql IR: 65 %
STONE WEIGHT KSTONE: 7.2 mg

## 2017-02-17 ENCOUNTER — Ambulatory Visit (HOSPITAL_COMMUNITY): Payer: 59

## 2017-04-01 ENCOUNTER — Ambulatory Visit (HOSPITAL_COMMUNITY): Admission: RE | Admit: 2017-04-01 | Payer: 59 | Source: Ambulatory Visit

## 2017-09-28 IMAGING — CT CT RENAL STONE PROTOCOL
2 of 3 series · 17 of 46 positions shown, 19 images · non-contrast
Comparison: None.

CLINICAL DATA: Left flank pain

EXAM:
CT ABDOMEN AND PELVIS WITHOUT CONTRAST
TECHNIQUE: Multidetector CT imaging of the abdomen and pelvis was performed
following the standard protocol without IV contrast.

[Series 2: axial st · axial · 0.70mm/px · z∈[-876,-476]mm · 14 of 92 slices shown, 16 images]
[im 6/92  soft-tissue]
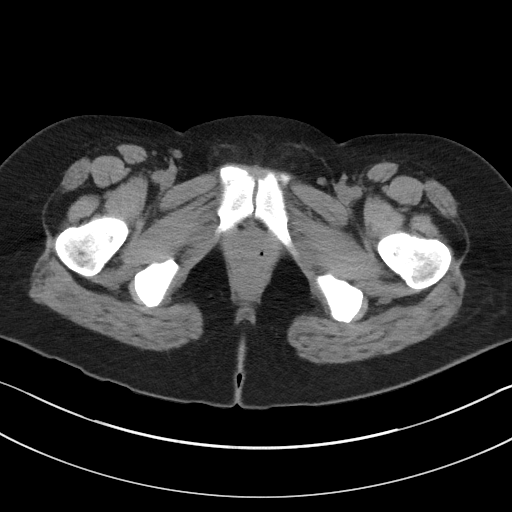
[im 6/92  bone]
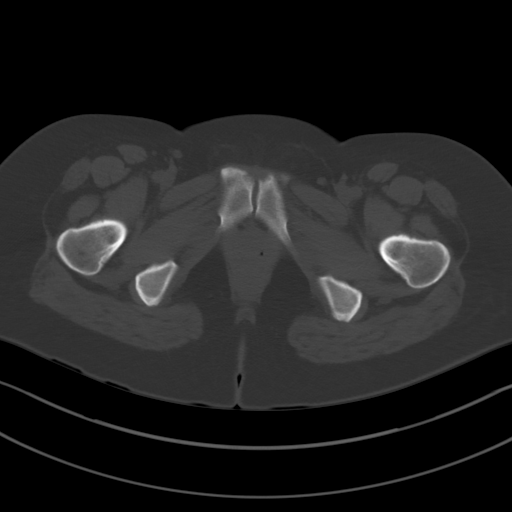
[im 12/92  soft-tissue]
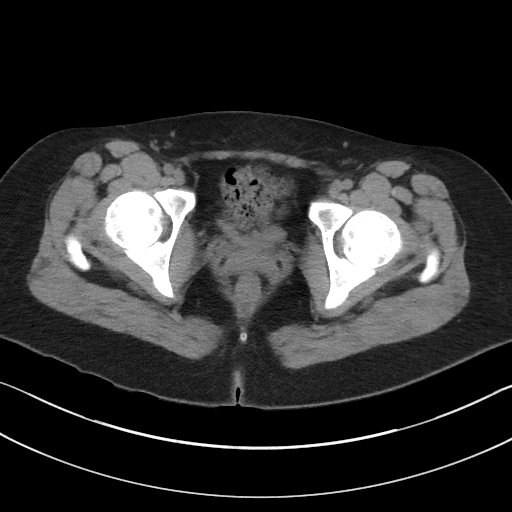
[im 18/92  soft-tissue]
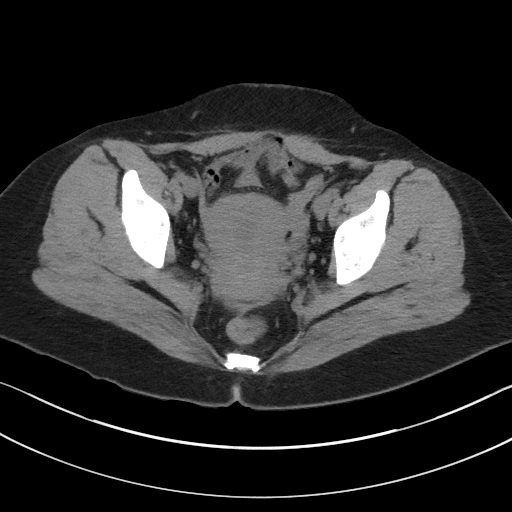
[im 24/92  soft-tissue]
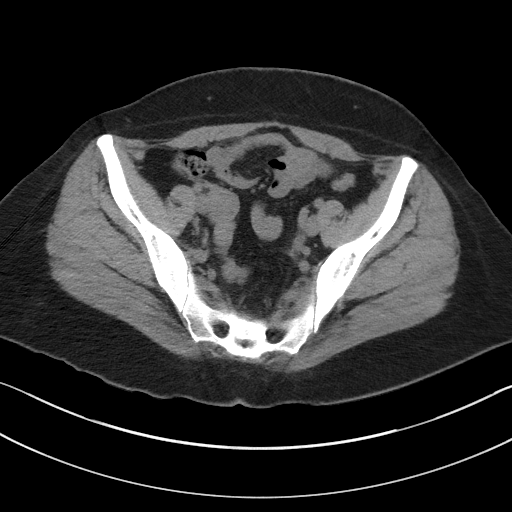
[im 30/92  soft-tissue]
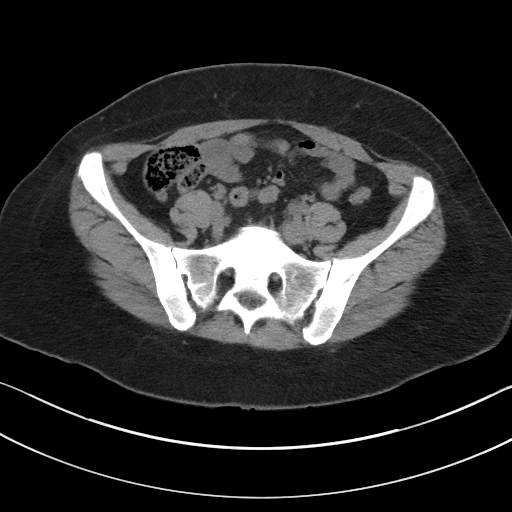
[im 36/92  soft-tissue]
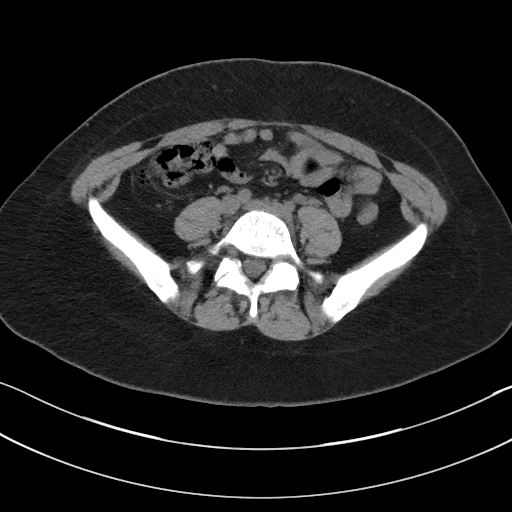
[im 42/92  soft-tissue]
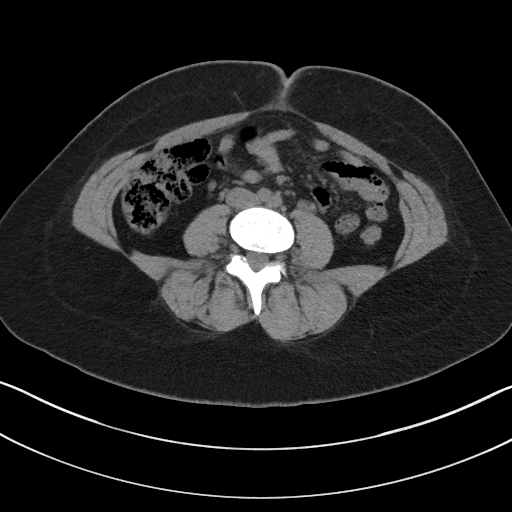
[im 50/92  soft-tissue]
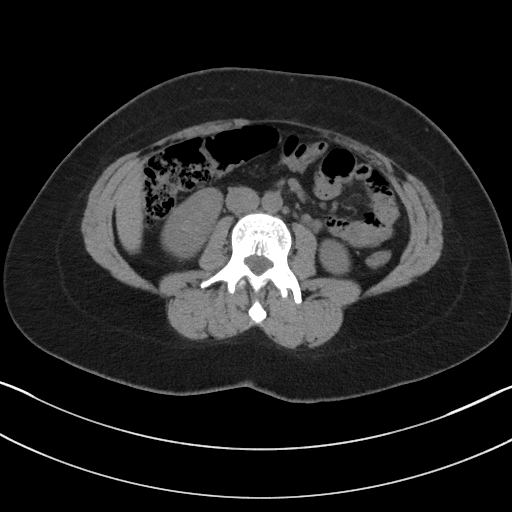
[im 56/92  soft-tissue]
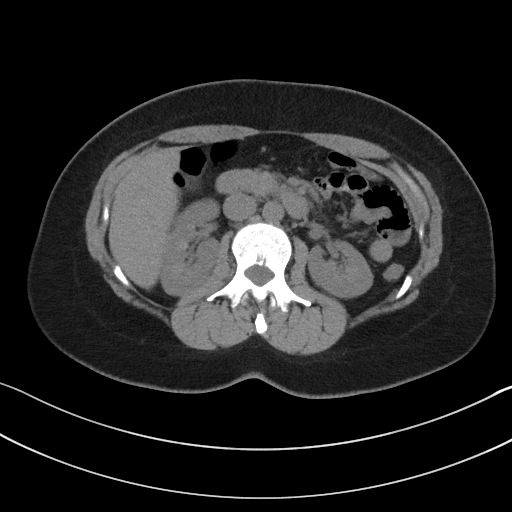
[im 56/92  bone]
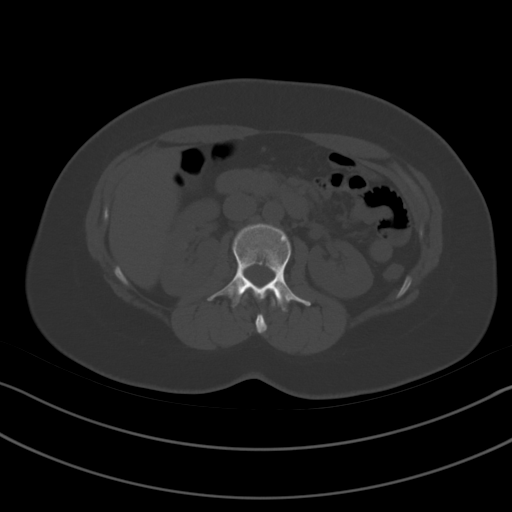
[im 62/92  soft-tissue]
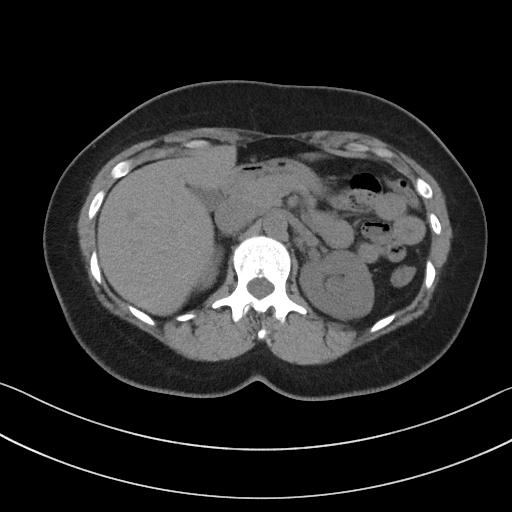
[im 68/92  soft-tissue]
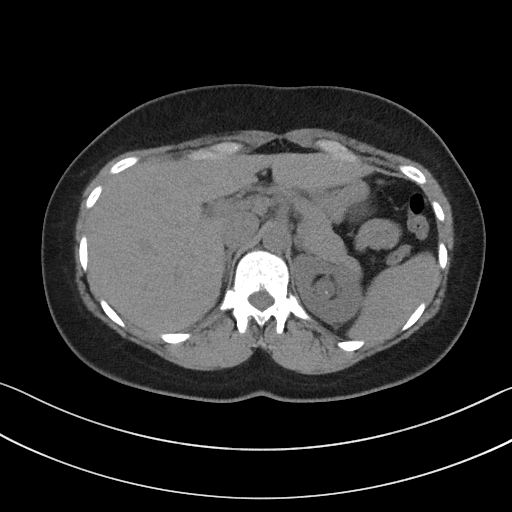
[im 74/92  soft-tissue]
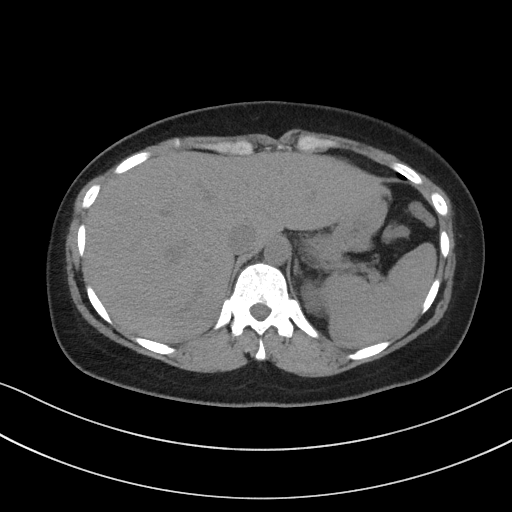
[im 80/92  soft-tissue]
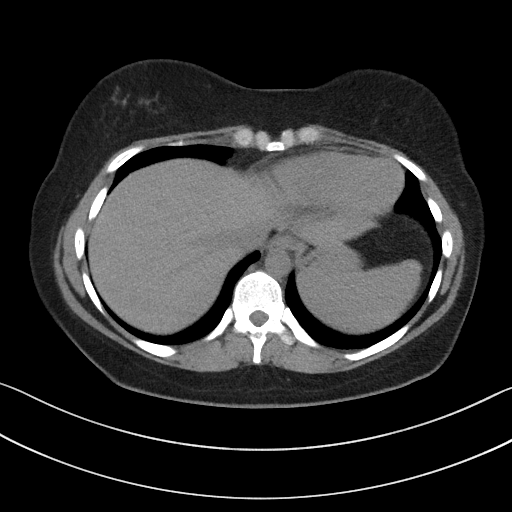
[im 86/92  soft-tissue]
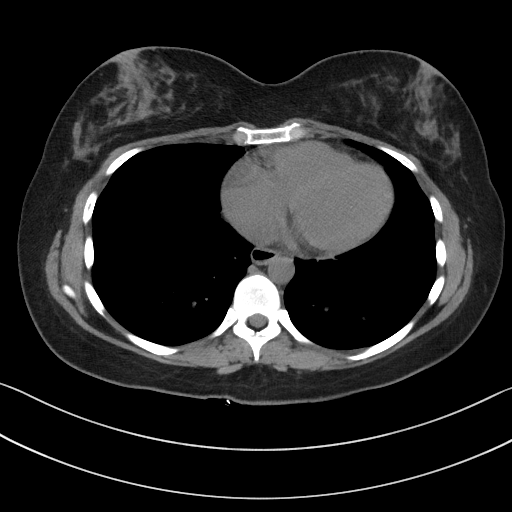

[Series 5: coronal st · coronal · 0.83mm/px · 3 of 96 slices shown]
[im 32/96  soft-tissue]
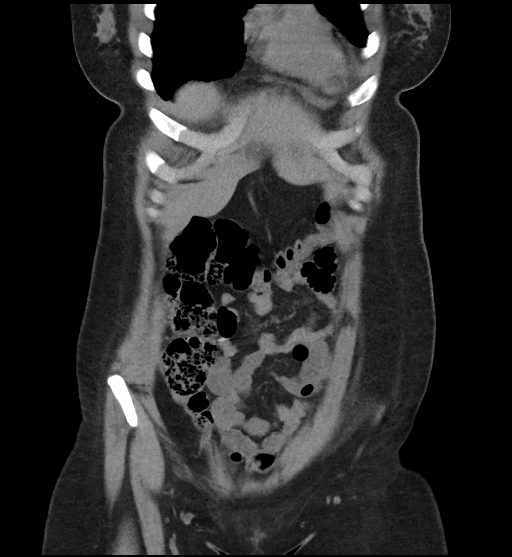
[im 43/96  soft-tissue]
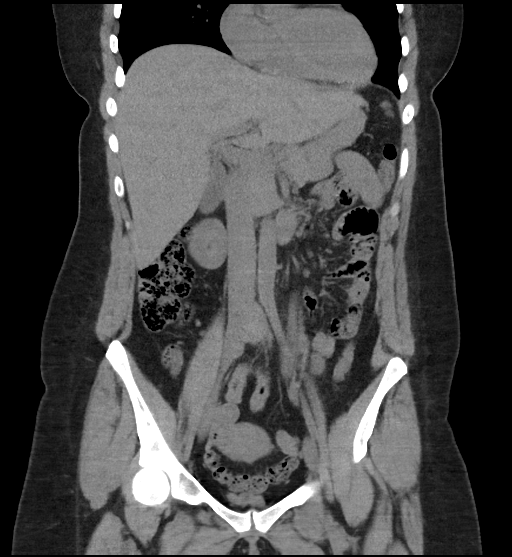
[im 53/96  soft-tissue]
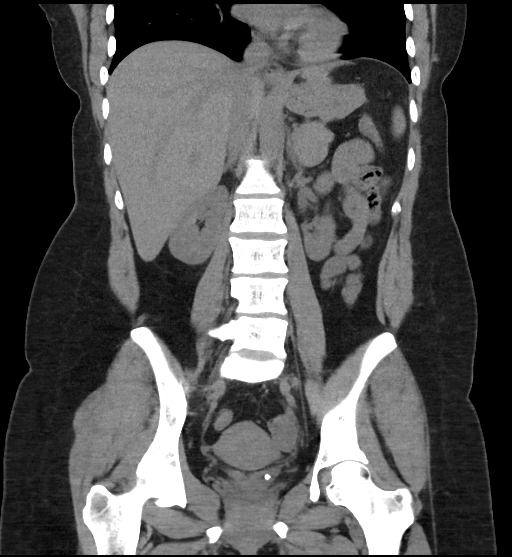

[17 of 46 positions shown; findings below may reference images not displayed]

FINDINGS: Lower chest: No acute abnormality.

Hepatobiliary: No focal liver abnormality is seen. No gallstones,
gallbladder wall thickening, or biliary dilatation.

Pancreas: Unremarkable. No pancreatic ductal dilatation or
surrounding inflammatory changes.

Spleen: Normal in size without focal abnormality.

Adrenals/Urinary Tract: Adrenal glands are within normal limits.
Punctate stones are present within both kidneys. Moderate left
hydronephrosis and hydroureter, secondary to a 5 mm stone in the
distal left ureter at the UVJ. The urinary bladder is empty.

Stomach/Bowel: Stomach is within normal limits. Appendix appears
normal. No evidence of bowel wall thickening, distention, or
inflammatory changes.

Vascular/Lymphatic: No significant vascular findings are present. No
enlarged abdominal or pelvic lymph nodes.

Reproductive: Uterus and bilateral adnexa are unremarkable.

Other: Tiny fat in the umbilicus.  No free air or free fluid.

Musculoskeletal: No acute or significant osseous findings.
IMPRESSION: 1. Moderate left hydronephrosis and hydroureter secondary to a 5 mm
stone in the distal left ureter at the UVJ.
2. Punctate stones present within both kidneys.

## 2020-08-21 ENCOUNTER — Ambulatory Visit: Payer: 59 | Admitting: Physician Assistant

## 2020-08-22 LAB — OB RESULTS CONSOLE RPR: RPR: NONREACTIVE

## 2020-08-22 LAB — OB RESULTS CONSOLE ABO/RH: RH Type: POSITIVE

## 2020-08-22 LAB — OB RESULTS CONSOLE RUBELLA ANTIBODY, IGM: Rubella: IMMUNE

## 2020-08-22 LAB — OB RESULTS CONSOLE ANTIBODY SCREEN: Antibody Screen: NEGATIVE

## 2020-08-22 LAB — OB RESULTS CONSOLE HEPATITIS B SURFACE ANTIGEN: Hepatitis B Surface Ag: NEGATIVE

## 2020-12-19 LAB — OB RESULTS CONSOLE HIV ANTIBODY (ROUTINE TESTING): HIV: NONREACTIVE

## 2021-02-25 LAB — OB RESULTS CONSOLE GBS: GBS: POSITIVE

## 2021-03-04 ENCOUNTER — Encounter (HOSPITAL_COMMUNITY): Payer: Self-pay | Admitting: *Deleted

## 2021-03-04 NOTE — Patient Instructions (Signed)
Andrea Rangel  03/04/2021   Your procedure is scheduled on:  03/18/2021  Arrive at 0530 at [facility] on [address] at [facility]  and [address]. You are invited to use the FREE valet parking or use the Visitor's parking deck.  Pick up the phone at the desk and dial [phone].  Call this number if you have problems the morning of surgery: [phone]  Remember:   Do not eat food:(After Midnight) Desps de medianoche.  Do not drink clear liquids: (After Midnight) Desps de medianoche.  Take these medicines the morning of surgery with A SIP OF WATER:  Take levothyroxine as prescribed   Do not wear jewelry, make-up or nail polish.  Do not wear lotions, powders, or perfumes. Do not wear deodorant.  Do not shave 48 hours prior to surgery.  Do not bring valuables to the hospital.  [facility] is not   responsible for any belongings or valuables brought to the hospital.  Contacts, dentures or bridgework may not be worn into surgery.  Leave suitcase in the car. After surgery it may be brought to your room.  For patients admitted to the hospital, checkout time is 11:00 AM the day of              discharge.      Please read over the following fact sheets that you were given:     Preparing for Surgery

## 2021-03-05 ENCOUNTER — Telehealth (HOSPITAL_COMMUNITY): Payer: Self-pay | Admitting: *Deleted

## 2021-03-05 NOTE — Telephone Encounter (Signed)
Preadmission screen  

## 2021-03-06 ENCOUNTER — Encounter (HOSPITAL_COMMUNITY): Payer: Self-pay

## 2021-03-13 NOTE — H&P (Signed)
Andrea Rangel is a 30 y.o. female presenting for scheduled repeat cesarean section with bilateral tubal ligation. She has a history of a cesarean section of an 8#12 boy [name]) and desires repeat CS with BTL. She has a history of hypothyroidism that has been well controlled with levothyroxine. She follows with Dr. Talmage Nap. She also has a history of a cardiac ablation n 2012 and is followed by [facility] without an recurrences.  She is expecting a girl - "Roselie Rangel". She works at [employer].    OB History     Gravida  2   Para  1   Term  1   Preterm      AB      Living  1      SAB      IAB      Ectopic      Multiple  0   Live Births  1          Past Medical History:  Diagnosis Date   Anemia    AVNRT (AV nodal re-entry tachycardia) ([facility])    s/p successful ablation in 2012   History of kidney stones    Hypothyroidism    Pain in joint    SVT (supraventricular tachycardia) ([facility])    AV nodal re-entry tachycardia (Dr. Meredeth Ide) s/p ablation   Tachycardia    Past Surgical History:  Procedure Laterality Date   ABLATION     cardiac   BLADDER SURGERY  2012   urethral dialtion   CESAREAN SECTION N/A 04/01/2016   Procedure: CESAREAN SECTION;  Surgeon: Ranae Pila, MD;  Location: [facility];  Service: Obstetrics;  Laterality: N/A;   CYSTOSCOPY WITH RETROGRADE PYELOGRAM, URETEROSCOPY AND STENT PLACEMENT Left 02/05/2017   Procedure: CYSTOSCOPY WITH RETROGRADE PYELOGRAM, URETEROSCOPY AND STENT PLACEMENT;  Surgeon: Malen Gauze, MD;  Location: [facility];  Service: Urology;  Laterality: Left;  1 HR [id number]   HOLMIUM LASER APPLICATION Left 02/05/2017   Procedure: HOLMIUM LASER APPLICATION;  Surgeon: Malen Gauze, MD;  Location: [facility];  Service: Urology;  Laterality: Left;   Family History: family history includes Heart Problems (age of onset: 46) in her maternal grandfather; Lung cancer in her maternal grandmother. Social  History:  reports that she has never smoked. She has never used smokeless tobacco. She reports that she does not drink alcohol and does not use drugs.     Maternal Diabetes: No Genetic Screening: Normal Maternal Ultrasounds/Referrals: Normal Fetal Ultrasounds or other Referrals:  None Maternal Substance Abuse:  No Significant Maternal Medications:  None Significant Maternal Lab Results:  Group B Strep positive Other Comments:  None  Review of Systems  HENT:  Negative for dental problem.   Respiratory:  Negative for apnea and chest tightness.   Gastrointestinal:  Negative for abdominal distention and abdominal pain.  Genitourinary:  Negative for dysuria.   History    unknown if currently breastfeeding. Exam Physical Exam  (from office) NAD, A&O NWOB Abd soft, nondistended, gravid  Prenatal labs: ABO, Rh: O/Positive/-- (01/13 0000) Antibody: Negative (01/13 0000) Rubella: Immune (01/13 0000) RPR: Nonreactive (01/13 0000)  HBsAg: Negative (01/13 0000)  HIV: Non-reactive (05/12 0000)  GBS:   Positive  Assessment/Plan: 30 yo presenting for scheduled repeat CS with requested bilateral tubal ligation. Risks discussed including infection, bleeding, damage to surrounding structures, the need for additional procedures including hysterectomy, and the possibility of uterine rupture with neonatal morbidity/mortality, scarring, and abnormal placentation with subsequent pregnancies. We also discussed  bilateral tubal ligation in detail. The alternatives to permanent sterilization were reviewed and it was discussed that this is considered permanent. We reviewed the risks in detail including regret, failure and ectopic pregnancy. She understands and agrees to proceed. 2g ancef on call to the OR.     Ranae Pila 03/13/2021, 10:45 AM

## 2021-03-17 ENCOUNTER — Encounter (HOSPITAL_COMMUNITY)
Admission: RE | Admit: 2021-03-17 | Discharge: 2021-03-17 | Disposition: A | Discharge status: Home / Self Care | Payer: BC Managed Care – PPO | Source: Ambulatory Visit | Attending: Obstetrics and Gynecology | Admitting: Obstetrics and Gynecology

## 2021-03-17 ENCOUNTER — Other Ambulatory Visit: Payer: Self-pay

## 2021-03-17 DIAGNOSIS — Z01812 Encounter for preprocedural laboratory examination: Secondary | ICD-10-CM | POA: Insufficient documentation

## 2021-03-17 HISTORY — DX: Hypothyroidism, unspecified: E03.9

## 2021-03-17 LAB — CBC
HCT: 39.4 % (ref 36.0–46.0)
Hemoglobin: 12.9 g/dL (ref 12.0–15.0)
MCH: 29.4 pg (ref 26.0–34.0)
MCHC: 32.7 g/dL (ref 30.0–36.0)
MCV: 89.7 fL (ref 80.0–100.0)
Platelets: 298 10*3/uL (ref 150–400)
RBC: 4.39 MIL/uL (ref 3.87–5.11)
RDW: 14.5 % (ref 11.5–15.5)
WBC: 8.1 10*3/uL (ref 4.0–10.5)
nRBC: 0 % (ref 0.0–0.2)

## 2021-03-17 LAB — TYPE AND SCREEN
ABO/RH(D): O POS
Antibody Screen: NEGATIVE

## 2021-03-17 NOTE — Anesthesia Preprocedure Evaluation (Addendum)
Anesthesia Evaluation  Patient identified by MRN, date of birth, ID band Patient awake    Reviewed: Allergy & Precautions, NPO status , Patient's Chart, lab work & pertinent test results  Airway Mallampati: III  TM Distance: >3 FB Neck ROM: Full    Dental no notable dental hx. (+) Teeth Intact, Dental Advisory Given   Pulmonary  Had COVID 1 mo ago, still testing positive today    Pulmonary exam normal breath sounds clear to auscultation       Cardiovascular Normal cardiovascular exam+ dysrhythmias (AVNRT s/p ablation 2012) Supra Ventricular Tachycardia  Rhythm:Regular Rate:Normal     Neuro/Psych negative neurological ROS  negative psych ROS   GI/Hepatic negative GI ROS, Neg liver ROS,   Endo/Other  Hypothyroidism Obesity BMI 38  Renal/GU negative Renal ROS  negative genitourinary   Musculoskeletal negative musculoskeletal ROS (+)   Abdominal (+) + obese,   Peds negative pediatric ROS (+)  Hematology hct 39.4, plt 298   Anesthesia Other Findings   Reproductive/Obstetrics (+) Pregnancy 1 prior c section                           Anesthesia Physical Anesthesia Plan  ASA: 2  Anesthesia Plan: Spinal   Post-op Pain Management:    Induction:   PONV Risk Score and Plan: 3 and Ondansetron, Dexamethasone and Treatment may vary due to age or medical condition  Airway Management Planned: Natural Airway and Nasal Cannula  Additional Equipment: None  Intra-op Plan:   Post-operative Plan:   Informed Consent: I have reviewed the patients History and Physical, chart, labs and discussed the procedure including the risks, benefits and alternatives for the proposed anesthesia with the patient or authorized representative who has indicated his/her understanding and acceptance.     Dental advisory given  Plan Discussed with: CRNA  Anesthesia Plan Comments:        Anesthesia Quick  Evaluation

## 2021-03-18 ENCOUNTER — Encounter (HOSPITAL_COMMUNITY)
Admission: RE | Disposition: A | Discharge status: Home / Self Care | Payer: Self-pay | Source: Home / Self Care | Attending: Obstetrics and Gynecology

## 2021-03-18 ENCOUNTER — Inpatient Hospital Stay (HOSPITAL_COMMUNITY): Payer: BC Managed Care – PPO | Admitting: Anesthesiology

## 2021-03-18 ENCOUNTER — Inpatient Hospital Stay (HOSPITAL_COMMUNITY)
Admission: RE | Admit: 2021-03-18 | Discharge: 2021-03-20 | DRG: 785 | Disposition: A | Discharge status: Home / Self Care | Payer: BC Managed Care – PPO | Attending: Obstetrics and Gynecology | Admitting: Obstetrics and Gynecology

## 2021-03-18 ENCOUNTER — Other Ambulatory Visit: Payer: Self-pay

## 2021-03-18 ENCOUNTER — Encounter (HOSPITAL_COMMUNITY): Payer: Self-pay | Admitting: Obstetrics and Gynecology

## 2021-03-18 DIAGNOSIS — Z3A39 39 weeks gestation of pregnancy: Secondary | ICD-10-CM

## 2021-03-18 DIAGNOSIS — Z302 Encounter for sterilization: Secondary | ICD-10-CM

## 2021-03-18 DIAGNOSIS — O99214 Obesity complicating childbirth: Secondary | ICD-10-CM | POA: Diagnosis present

## 2021-03-18 DIAGNOSIS — Z3490 Encounter for supervision of normal pregnancy, unspecified, unspecified trimester: Secondary | ICD-10-CM

## 2021-03-18 DIAGNOSIS — O99824 Streptococcus B carrier state complicating childbirth: Secondary | ICD-10-CM | POA: Diagnosis present

## 2021-03-18 DIAGNOSIS — E039 Hypothyroidism, unspecified: Secondary | ICD-10-CM | POA: Diagnosis present

## 2021-03-18 DIAGNOSIS — O34211 Maternal care for low transverse scar from previous cesarean delivery: Secondary | ICD-10-CM | POA: Diagnosis present

## 2021-03-18 DIAGNOSIS — Z8616 Personal history of COVID-19: Secondary | ICD-10-CM

## 2021-03-18 DIAGNOSIS — O99284 Endocrine, nutritional and metabolic diseases complicating childbirth: Secondary | ICD-10-CM | POA: Diagnosis present

## 2021-03-18 LAB — RESP PANEL BY RT-PCR (FLU A&B, COVID) ARPGX2
Influenza A by PCR: NEGATIVE
Influenza B by PCR: NEGATIVE
SARS Coronavirus 2 by RT PCR: POSITIVE — AB

## 2021-03-18 LAB — RPR: RPR Ser Ql: NONREACTIVE

## 2021-03-18 SURGERY — Surgical Case
Anesthesia: Spinal

## 2021-03-18 MED ORDER — ONDANSETRON HCL 4 MG/2ML IJ SOLN: 4.0000 mg | Freq: Three times a day (TID) | INTRAMUSCULAR | Status: DC | PRN

## 2021-03-18 MED ORDER — HYDROMORPHONE HCL 1 MG/ML IJ SOLN: 0.2000 mg | INTRAMUSCULAR | Status: DC | PRN

## 2021-03-18 MED ORDER — SENNOSIDES-DOCUSATE SODIUM 8.6-50 MG PO TABS
2.0000 | ORAL_TABLET | Freq: Every day | ORAL | Status: DC
  Administered 2021-03-19 – 2021-03-20 (×2): 2 via ORAL
  Filled 2021-03-18 (×2): qty 2

## 2021-03-18 MED ORDER — MORPHINE SULFATE (PF) 0.5 MG/ML IJ SOLN
INTRAMUSCULAR | Status: AC
  Filled 2021-03-18: qty 10

## 2021-03-18 MED ORDER — KETOROLAC TROMETHAMINE 30 MG/ML IJ SOLN: 30.0000 mg | Freq: Four times a day (QID) | INTRAMUSCULAR | Status: DC | PRN

## 2021-03-18 MED ORDER — NALBUPHINE HCL 10 MG/ML IJ SOLN: 5.0000 mg | INTRAMUSCULAR | Status: DC | PRN

## 2021-03-18 MED ORDER — DEXAMETHASONE SODIUM PHOSPHATE 10 MG/ML IJ SOLN
INTRAMUSCULAR | Status: DC | PRN
  Administered 2021-03-18: 4 mg via INTRAVENOUS

## 2021-03-18 MED ORDER — OXYCODONE HCL 5 MG/5ML PO SOLN: 5.0000 mg | Freq: Once | ORAL | Status: DC | PRN

## 2021-03-18 MED ORDER — LACTATED RINGERS IV BOLUS
500.0000 mL | Freq: Once | INTRAVENOUS | Status: AC
  Administered 2021-03-18: 500 mL via INTRAVENOUS

## 2021-03-18 MED ORDER — KETOROLAC TROMETHAMINE 30 MG/ML IJ SOLN
INTRAMUSCULAR | Status: AC
  Filled 2021-03-18: qty 1

## 2021-03-18 MED ORDER — LACTATED RINGERS IV SOLN: INTRAVENOUS | Status: DC

## 2021-03-18 MED ORDER — SIMETHICONE 80 MG PO CHEW: 80.0000 mg | CHEWABLE_TABLET | ORAL | Status: DC | PRN

## 2021-03-18 MED ORDER — TETANUS-DIPHTH-ACELL PERTUSSIS 5-2.5-18.5 LF-MCG/0.5 IM SUSY: 0.5000 mL | PREFILLED_SYRINGE | Freq: Once | INTRAMUSCULAR | Status: DC

## 2021-03-18 MED ORDER — MORPHINE SULFATE (PF) 10 MG/ML IV SOLN
INTRAVENOUS | Status: DC | PRN
  Administered 2021-03-18: .15 mg via INTRATHECAL

## 2021-03-18 MED ORDER — OXYCODONE HCL 5 MG PO TABS: 5.0000 mg | ORAL_TABLET | ORAL | Status: DC | PRN

## 2021-03-18 MED ORDER — PRENATAL MULTIVITAMIN CH
1.0000 | ORAL_TABLET | Freq: Every day | ORAL | Status: DC
  Administered 2021-03-18 – 2021-03-20 (×3): 1 via ORAL
  Filled 2021-03-18 (×3): qty 1

## 2021-03-18 MED ORDER — ONDANSETRON HCL 4 MG/2ML IJ SOLN
INTRAMUSCULAR | Status: DC | PRN
  Administered 2021-03-18: 4 mg via INTRAVENOUS

## 2021-03-18 MED ORDER — OXYTOCIN-SODIUM CHLORIDE 30-0.9 UT/500ML-% IV SOLN: INTRAVENOUS | Status: DC | PRN

## 2021-03-18 MED ORDER — OXYTOCIN-SODIUM CHLORIDE 30-0.9 UT/500ML-% IV SOLN
INTRAVENOUS | Status: DC | PRN
  Administered 2021-03-18: 250 mL via INTRAVENOUS

## 2021-03-18 MED ORDER — CEFAZOLIN SODIUM-DEXTROSE 2-4 GM/100ML-% IV SOLN
INTRAVENOUS | Status: AC
  Filled 2021-03-18: qty 100

## 2021-03-18 MED ORDER — HYDROMORPHONE HCL 1 MG/ML IJ SOLN: 0.2500 mg | INTRAMUSCULAR | Status: DC | PRN

## 2021-03-18 MED ORDER — SCOPOLAMINE 1 MG/3DAYS TD PT72
MEDICATED_PATCH | TRANSDERMAL | Status: AC
  Filled 2021-03-18: qty 1

## 2021-03-18 MED ORDER — COCONUT OIL OIL: 1.0000 "application " | TOPICAL_OIL | Status: DC | PRN

## 2021-03-18 MED ORDER — KETOROLAC TROMETHAMINE 30 MG/ML IJ SOLN
30.0000 mg | Freq: Four times a day (QID) | INTRAMUSCULAR | Status: AC
  Administered 2021-03-18: 30 mg via INTRAVENOUS
  Filled 2021-03-18 (×2): qty 1

## 2021-03-18 MED ORDER — DIPHENHYDRAMINE HCL 25 MG PO CAPS: 25.0000 mg | ORAL_CAPSULE | ORAL | Status: DC | PRN

## 2021-03-18 MED ORDER — NALOXONE HCL 4 MG/10ML IJ SOLN
1.0000 ug/kg/h | INTRAVENOUS | Status: DC | PRN
  Filled 2021-03-18: qty 5

## 2021-03-18 MED ORDER — SOD CITRATE-CITRIC ACID 500-334 MG/5ML PO SOLN
30.0000 mL | ORAL | Status: AC
  Administered 2021-03-18: 30 mL via ORAL

## 2021-03-18 MED ORDER — ACETAMINOPHEN 500 MG PO TABS: 1000.0000 mg | ORAL_TABLET | Freq: Four times a day (QID) | ORAL | Status: DC

## 2021-03-18 MED ORDER — ACETAMINOPHEN 500 MG PO TABS
1000.0000 mg | ORAL_TABLET | Freq: Four times a day (QID) | ORAL | Status: DC
  Administered 2021-03-18 – 2021-03-20 (×8): 1000 mg via ORAL
  Filled 2021-03-18 (×9): qty 2

## 2021-03-18 MED ORDER — NALOXONE HCL 0.4 MG/ML IJ SOLN: 0.4000 mg | INTRAMUSCULAR | Status: DC | PRN

## 2021-03-18 MED ORDER — PHENYLEPHRINE HCL-NACL 20-0.9 MG/250ML-% IV SOLN
INTRAVENOUS | Status: AC
  Filled 2021-03-18: qty 250

## 2021-03-18 MED ORDER — SODIUM CHLORIDE 0.9 % IR SOLN
Status: DC | PRN
  Administered 2021-03-18: 1

## 2021-03-18 MED ORDER — SODIUM CHLORIDE 0.9% FLUSH: 3.0000 mL | INTRAVENOUS | Status: DC | PRN

## 2021-03-18 MED ORDER — CEFAZOLIN SODIUM-DEXTROSE 2-4 GM/100ML-% IV SOLN
2.0000 g | INTRAVENOUS | Status: AC
  Administered 2021-03-18: 2 g via INTRAVENOUS

## 2021-03-18 MED ORDER — PHENYLEPHRINE HCL-NACL 20-0.9 MG/250ML-% IV SOLN
INTRAVENOUS | Status: DC | PRN
  Administered 2021-03-18: 30 ug/min via INTRAVENOUS

## 2021-03-18 MED ORDER — FENTANYL CITRATE (PF) 100 MCG/2ML IJ SOLN
INTRAMUSCULAR | Status: AC
  Filled 2021-03-18: qty 2

## 2021-03-18 MED ORDER — ZOLPIDEM TARTRATE 5 MG PO TABS: 5.0000 mg | ORAL_TABLET | Freq: Every evening | ORAL | Status: DC | PRN

## 2021-03-18 MED ORDER — DIPHENHYDRAMINE HCL 50 MG/ML IJ SOLN: 12.5000 mg | INTRAMUSCULAR | Status: DC | PRN

## 2021-03-18 MED ORDER — MENTHOL 3 MG MT LOZG: 1.0000 | LOZENGE | OROMUCOSAL | Status: DC | PRN

## 2021-03-18 MED ORDER — DIBUCAINE (PERIANAL) 1 % EX OINT: 1.0000 "application " | TOPICAL_OINTMENT | CUTANEOUS | Status: DC | PRN

## 2021-03-18 MED ORDER — FENTANYL CITRATE (PF) 100 MCG/2ML IJ SOLN: INTRAMUSCULAR | Status: DC | PRN

## 2021-03-18 MED ORDER — DIPHENHYDRAMINE HCL 25 MG PO CAPS: 25.0000 mg | ORAL_CAPSULE | Freq: Four times a day (QID) | ORAL | Status: DC | PRN

## 2021-03-18 MED ORDER — WITCH HAZEL-GLYCERIN EX PADS: 1.0000 "application " | MEDICATED_PAD | CUTANEOUS | Status: DC | PRN

## 2021-03-18 MED ORDER — NALBUPHINE HCL 10 MG/ML IJ SOLN: 5.0000 mg | Freq: Once | INTRAMUSCULAR | Status: DC | PRN

## 2021-03-18 MED ORDER — LEVOTHYROXINE SODIUM 137 MCG PO TABS
137.0000 ug | ORAL_TABLET | Freq: Every morning | ORAL | Status: DC
  Administered 2021-03-19 – 2021-03-20 (×2): 137 ug via ORAL
  Filled 2021-03-18 (×3): qty 1

## 2021-03-18 MED ORDER — DEXAMETHASONE SODIUM PHOSPHATE 4 MG/ML IJ SOLN
INTRAMUSCULAR | Status: AC
  Filled 2021-03-18: qty 1

## 2021-03-18 MED ORDER — PROMETHAZINE HCL 25 MG/ML IJ SOLN: 6.2500 mg | INTRAMUSCULAR | Status: DC | PRN

## 2021-03-18 MED ORDER — ONDANSETRON HCL 4 MG/2ML IJ SOLN
INTRAMUSCULAR | Status: AC
  Filled 2021-03-18: qty 2

## 2021-03-18 MED ORDER — FENTANYL CITRATE (PF) 100 MCG/2ML IJ SOLN
INTRAMUSCULAR | Status: DC | PRN
  Administered 2021-03-18: 15 ug via INTRATHECAL

## 2021-03-18 MED ORDER — BUPIVACAINE IN DEXTROSE 0.75-8.25 % IT SOLN
INTRATHECAL | Status: DC | PRN
  Administered 2021-03-18: 1.7 mL via INTRATHECAL

## 2021-03-18 MED ORDER — KETOROLAC TROMETHAMINE 30 MG/ML IJ SOLN
30.0000 mg | Freq: Once | INTRAMUSCULAR | Status: AC | PRN
  Administered 2021-03-18: 30 mg via INTRAVENOUS

## 2021-03-18 MED ORDER — NALBUPHINE HCL 10 MG/ML IJ SOLN
5.0000 mg | INTRAMUSCULAR | Status: DC | PRN
Start: 2021-03-18 — End: 2021-03-20

## 2021-03-18 MED ORDER — IBUPROFEN 600 MG PO TABS
600.0000 mg | ORAL_TABLET | Freq: Four times a day (QID) | ORAL | Status: DC
  Administered 2021-03-19 – 2021-03-20 (×5): 600 mg via ORAL
  Filled 2021-03-18 (×5): qty 1

## 2021-03-18 MED ORDER — SOD CITRATE-CITRIC ACID 500-334 MG/5ML PO SOLN
ORAL | Status: AC
  Filled 2021-03-18: qty 30

## 2021-03-18 MED ORDER — OXYTOCIN-SODIUM CHLORIDE 30-0.9 UT/500ML-% IV SOLN: 2.5000 [IU]/h | INTRAVENOUS | Status: DC

## 2021-03-18 MED ORDER — OXYCODONE HCL 5 MG PO TABS: 5.0000 mg | ORAL_TABLET | Freq: Once | ORAL | Status: DC | PRN

## 2021-03-18 MED ORDER — SIMETHICONE 80 MG PO CHEW
80.0000 mg | CHEWABLE_TABLET | Freq: Three times a day (TID) | ORAL | Status: DC
  Administered 2021-03-18 – 2021-03-20 (×6): 80 mg via ORAL
  Filled 2021-03-18 (×7): qty 1

## 2021-03-18 MED ORDER — MEPERIDINE HCL 25 MG/ML IJ SOLN: 6.2500 mg | INTRAMUSCULAR | Status: DC | PRN

## 2021-03-18 MED ORDER — SCOPOLAMINE 1 MG/3DAYS TD PT72
1.0000 | MEDICATED_PATCH | Freq: Once | TRANSDERMAL | Status: DC
Start: 2021-03-18 — End: 2021-03-20
  Administered 2021-03-18: 1.5 mg via TRANSDERMAL

## 2021-03-18 SURGICAL SUPPLY — 41 items
ADH SKN CLS APL DERMABOND .7 (GAUZE/BANDAGES/DRESSINGS) ×1
APL SKNCLS STERI-STRIP NONHPOA (GAUZE/BANDAGES/DRESSINGS) ×1
BENZOIN TINCTURE PRP APPL 2/3 (GAUZE/BANDAGES/DRESSINGS) ×2 IMPLANT
CHLORAPREP W/TINT 26ML (MISCELLANEOUS) ×2 IMPLANT
CLAMP CORD UMBIL (MISCELLANEOUS) IMPLANT
CLOTH BEACON ORANGE TIMEOUT ST (SAFETY) ×2 IMPLANT
DERMABOND ADVANCED (GAUZE/BANDAGES/DRESSINGS) ×1
DERMABOND ADVANCED .7 DNX12 (GAUZE/BANDAGES/DRESSINGS) ×1 IMPLANT
DRSG OPSITE POSTOP 4X10 (GAUZE/BANDAGES/DRESSINGS) ×2 IMPLANT
ELECT REM PT RETURN 9FT ADLT (ELECTROSURGICAL) ×2
ELECTRODE REM PT RTRN 9FT ADLT (ELECTROSURGICAL) ×1 IMPLANT
EXTRACTOR VACUUM KIWI (MISCELLANEOUS) IMPLANT
GLOVE BIO SURGEON STRL SZ 6.5 (GLOVE) ×2 IMPLANT
GLOVE BIOGEL PI IND STRL 6.5 (GLOVE) ×1 IMPLANT
GLOVE BIOGEL PI IND STRL 7.0 (GLOVE) ×2 IMPLANT
GLOVE BIOGEL PI INDICATOR 6.5 (GLOVE) ×1
GLOVE BIOGEL PI INDICATOR 7.0 (GLOVE) ×2
GOWN STRL REUS W/TWL LRG LVL3 (GOWN DISPOSABLE) ×4 IMPLANT
HEMOSTAT ARISTA ABSORB 3G PWDR (HEMOSTASIS) ×2 IMPLANT
KIT ABG SYR 3ML LUER SLIP (SYRINGE) ×2 IMPLANT
LIGASURE IMPACT 36 18CM CVD LR (INSTRUMENTS) ×2 IMPLANT
NEEDLE HYPO 25X5/8 SAFETYGLIDE (NEEDLE) ×2 IMPLANT
NS IRRIG 1000ML POUR BTL (IV SOLUTION) ×2 IMPLANT
PACK C SECTION WH (CUSTOM PROCEDURE TRAY) ×2 IMPLANT
PAD OB MATERNITY 4.3X12.25 (PERSONAL CARE ITEMS) ×2 IMPLANT
PENCIL SMOKE EVAC W/HOLSTER (ELECTROSURGICAL) ×2 IMPLANT
RETRACTOR TRAXI PANNICULUS (MISCELLANEOUS) ×1 IMPLANT
STRIP CLOSURE SKIN 1/4X4 (GAUZE/BANDAGES/DRESSINGS) ×2 IMPLANT
SUT PLAIN 0 NONE (SUTURE) IMPLANT
SUT PLAIN 2 0 (SUTURE) ×2
SUT PLAIN ABS 2-0 CT1 27XMFL (SUTURE) ×1 IMPLANT
SUT VIC AB 0 CT1 36 (SUTURE) ×2 IMPLANT
SUT VIC AB 0 CTX 36 (SUTURE) ×4
SUT VIC AB 0 CTX36XBRD ANBCTRL (SUTURE) ×2 IMPLANT
SUT VIC AB 3-0 CT1 27 (SUTURE) ×2
SUT VIC AB 3-0 CT1 TAPERPNT 27 (SUTURE) ×1 IMPLANT
SUT VIC AB 4-0 PS2 27 (SUTURE) ×2 IMPLANT
TOWEL OR 17X24 6PK STRL BLUE (TOWEL DISPOSABLE) ×2 IMPLANT
TRAXI PANNICULUS RETRACTOR (MISCELLANEOUS) ×1
TRAY FOLEY W/BAG SLVR 14FR LF (SET/KITS/TRAYS/PACK) IMPLANT
WATER STERILE IRR 1000ML POUR (IV SOLUTION) ×2 IMPLANT

## 2021-03-18 NOTE — Anesthesia Procedure Notes (Signed)
Spinal  Patient location during procedure: OR Start time: 03/18/2021 7:20 AM End time: 03/18/2021 7:22 AM Reason for block: surgical anesthesia Staffing Performed: anesthesiologist  Anesthesiologist: Lannie Fields, DO Preanesthetic Checklist Completed: patient identified, IV checked, risks and benefits discussed, surgical consent, monitors and equipment checked, pre-op evaluation and timeout performed Spinal Block Patient position: sitting Prep: DuraPrep and site prepped and draped Patient monitoring: cardiac monitor, continuous pulse ox and blood pressure Approach: midline Location: L3-4 Injection technique: single-shot Needle Needle type: Pencan  Needle gauge: 24 G Needle length: 9 cm Assessment Sensory level: T6 Events: CSF return Additional Notes Functioning IV was confirmed and monitors were applied. Sterile prep and drape, including hand hygiene and sterile gloves were used. The patient was positioned and the spine was prepped. The skin was anesthetized with lidocaine.  Free flow of clear CSF was obtained prior to injecting local anesthetic into the CSF.  The spinal needle aspirated freely following injection.  The needle was carefully withdrawn.  The patient tolerated the procedure well.

## 2021-03-18 NOTE — Anesthesia Postprocedure Evaluation (Signed)
Anesthesia Post Note  Patient: Andrea Rangel  Procedure(s) Performed: REPEAT CESAREAN SECTION WITH BILATERAL TUBAL LIGATION EDC: 03-23-21 ALLERG: NKDA  PREVIOUS X 1     Patient location during evaluation: PACU Anesthesia Type: Spinal Level of consciousness: awake and alert and oriented Pain management: pain level controlled Vital Signs Assessment: post-procedure vital signs reviewed and stable Respiratory status: spontaneous breathing, nonlabored ventilation and respiratory function stable Cardiovascular status: blood pressure returned to baseline and stable Postop Assessment: no headache, no backache, spinal receding and patient able to bend at knees Anesthetic complications: no   No notable events documented.  Last Vitals:  Vitals:   03/18/21 0945 03/18/21 1014  BP: (!) 128/59 121/75  Pulse: 89 88  Resp: 15 16  Temp: 36.5 C   SpO2: 99% 99%    Last Pain:  Vitals:   03/18/21 0945  TempSrc: Oral  PainSc:    Pain Goal:    LLE Motor Response: Purposeful movement (03/18/21 0930) LLE Sensation: Tingling (03/18/21 0930) RLE Motor Response: Purposeful movement (03/18/21 0930) RLE Sensation: Tingling (03/18/21 0930)     Epidural/Spinal Function Cutaneous sensation: Tingles (03/18/21 0930), Patient able to flex knees: Yes (03/18/21 0930), Patient able to lift hips off bed: No (03/18/21 0930), Back pain beyond tenderness at insertion site: No (03/18/21 0930), Progressively worsening motor and/or sensory loss: No (03/18/21 0930), Bowel and/or bladder incontinence post epidural: No (03/18/21 0930)  Lannie Fields

## 2021-03-18 NOTE — Op Note (Signed)
PROCEDURE DATE: 03/18/21   PREOPERATIVE DIAGNOSIS: Intrauterine pregnancy at  39.2 wga, Indication: repeat cesarean section and desires permanent sterility.    POSTOPERATIVE DIAGNOSIS: The same   PROCEDURE:    Repear Low Transverse Cesarean Section with BTL   SURGEON:  Dr. Belva Agee   INDICATIONS: This is a 30yo G2P1001 at 36.2 wga requiring cesarean section secondary to desires repeat and desires permanent sterility. She was incidentally found to be COVID positive and is without symptoms as she has recovered from the virus. Contact precautions were placed.  Decision made to proceed with LTCS. The risks of cesarean section discussed with the patient included but were not limited to: bleeding which may require transfusion or reoperation; infection which may require antibiotics; injury to bowel, bladder, ureters or other surrounding organs; injury to the fetus; need for additional procedures including hysterectomy in the event of a life-threatening hemorrhage; placental abnormalities wth subsequent pregnancies, incisional problems, thromboembolic phenomenon and other postoperative/anesthesia complications. We also discussed bilateral tubal ligation in detail. The alternatives to permanent sterilization were reviewed and it was discussed that this is considered permanent. We reviewed the risks in detail including regret, failure and ectopic pregnancy.   The patient agreed with the proposed plan, giving informed consent for the procedure.     FINDINGS:  Viable female infant in vertex presentation, APGARspending,  Weight pending, Amniotic fluid clear,  Intact placenta, three vessel cord.  Grossly normal uterus, ovaries and fallopian tubes. .   ANESTHESIA:    Epidural ESTIMATED BLOOD LOSS: 884cc SPECIMENS: Placenta for routine COMPLICATIONS: None immediate   PROCEDURE IN DETAIL:     The patient received intravenous antibiotics (2g Ancef) and had sequential compression devices applied to her lower  extremities while in the preoperative area.  She was then taken to the operating room where epidural anesthesia was dosed up to surgical level and was found to be adequate. She was then placed in a dorsal supine position with a leftward tilt, and prepped and draped in a sterile manner.  A foley catheter was placed into her bladder and attached to constant gravity.  After an adequate timeout was performed, a Pfannenstiel skin incision was made with scalpel and carried through to the underlying layer of fascia. The fascia was incised in the midline and this incision was extended bilaterally using the Mayo scissors. Kocher clamps were applied to the superior aspect of the fascial incision and the underlying rectus muscles were dissected off bluntly. A similar process was carried out on the inferior aspect of the facial incision. The rectus muscles were separated in the midline bluntly and the peritoneum was entered bluntly.  A bladder flap was created sharply and developed bluntly. A transverse hysterotomy was made with a scalpel and extended bilaterally bluntly. The bladder blade was then removed. The infant was successfully delivered, and cord was clamped and cut and infant was handed over to awaiting neonatology team. Uterine massage was then administered and the placenta delivered intact with three-vessel cord. Cord gases were taken. The uterus was cleared of clot and debris.  The hysterotomy was closed with 0 vicryl.  A second imbricating suture of 0-vicryl was used to reinforce the incision and aid in hemostasis. A figure of eight in RLUS was done for hemostasis.  A bilateral tubal ligation was performed via ligasure.  Good hemostasis was noted before and after uterus and fallopian tubes placed back into abdomen. Arista placed. The fascia was closed with 0-Vicryl in a running fashion with good restoration of  anatomy.  The subcutaneus tissue was irrigated and was reapproximated using three interrupted plain gut  stitches.  The skin was closed with 4-0 Vicryl in a subcuticular fashion.   Final EBL was 884cc (all surgical site and was hemostatic at end of procedure) without any further bleeding on exam.   It's a girl - "[name]"!!    Pt tolerated the procedure well. All sponge/lap/needle counts were correct  X 2. Pt taken to recovery room in stable condition.     Belva Agee MD

## 2021-03-18 NOTE — Transfer of Care (Signed)
Immediate Anesthesia Transfer of Care Note  Patient: Andrea Rangel  Procedure(s) Performed: REPEAT CESAREAN SECTION WITH BILATERAL TUBAL LIGATION EDC: 03-23-21 ALLERG: NKDA  PREVIOUS X 1  Patient Location: PACU  Anesthesia Type:Spinal  Level of Consciousness: awake, alert  and patient cooperative  Airway & Oxygen Therapy: Patient Spontanous Breathing  Post-op Assessment: Report given to RN and Post -op Vital signs reviewed and stable  Post vital signs: Reviewed and stable  Last Vitals:  Vitals Value Taken Time  BP 116/59 03/18/21 0842  Temp    Pulse 91 03/18/21 0844  Resp 25 03/18/21 0844  SpO2 100 % 03/18/21 0844  Vitals shown include unvalidated device data.  Last Pain:  Vitals:   03/18/21 0552  TempSrc:   PainSc: 0-No pain         Complications: No notable events documented.

## 2021-03-18 NOTE — Progress Notes (Signed)
RN was notified by day shift RN that pt tested + for covid on 01/2021. Due to no provider documentation in her prenatal records of this, the pt was re-tested on this admission and still remained + for covid since she was still within the 90 day period. With that being said, the pt provided documentation of when she was quarantined in 01/2021. RN clarified this information with Dr. Langston Masker, who then gave RN a verbal order to discontinue pt's Airborne Precautions. RN notified the Charge RN and the [facility] of the situation. RN was given instructions by them to "ask Dr. Langston Masker to write a note in the pt chart stating that she was okay with this decision". RN called Dr. Langston Masker again. She stated that "she would put it on her list of to-do's". RN discontinued precautions order due to verbal order by MD but won't physically take her off until MD's note has been submitted.    Herbert Moors, RN

## 2021-03-18 NOTE — Progress Notes (Signed)
Patient arrived NPO and was consented in PACU. Her COVID test came back positive. Patient and husband report recovery from covid over a week or two ago. However, patient does not have proof of a positive test. Thus, covid precautions were placed. Risks again discussed, all questions answered, and consent signed. Proceed with above surgery. She is 100% sure of her choice.    Belva Agee MD

## 2021-03-19 LAB — CBC
HCT: 27 % — ABNORMAL LOW (ref 36.0–46.0)
Hemoglobin: 8.8 g/dL — ABNORMAL LOW (ref 12.0–15.0)
MCH: 29.1 pg (ref 26.0–34.0)
MCHC: 32.6 g/dL (ref 30.0–36.0)
MCV: 89.4 fL (ref 80.0–100.0)
Platelets: 226 10*3/uL (ref 150–400)
RBC: 3.02 MIL/uL — ABNORMAL LOW (ref 3.87–5.11)
RDW: 14.4 % (ref 11.5–15.5)
WBC: 12 10*3/uL — ABNORMAL HIGH (ref 4.0–10.5)
nRBC: 0 % (ref 0.0–0.2)

## 2021-03-19 NOTE — Progress Notes (Addendum)
Subjective: Postpartum Day 1: Cesarean Delivery Patient reports tolerating PO and no problems voiding.    Objective: Vital signs in last 24 hours: Temp:  [97.6 F (36.4 C)-98.8 F (37.1 C)] 98.1 F (36.7 C) (08/10 0610) Pulse Rate:  [60-110] 78 (08/10 0610) Resp:  [15-22] 18 (08/10 0610) BP: (96-128)/(59-82) 101/66 (08/10 0610) SpO2:  [98 %-100 %] 99 % (08/10 0610)  Physical Exam:  General: alert Lochia: appropriate Uterine Fundus: firm Incision: healing well DVT Evaluation: No evidence of DVT seen on physical exam.  Recent Labs    03/17/21 0903 03/19/21 0549  HGB 12.9 8.8*  HCT 39.4 27.0*    Assessment/Plan: Status post Cesarean section. Doing well postoperatively.  Continue current care. Can take off COVID precautions given the clinical situation. Patient is completely asymptomatic and has documentation in [place] with home positive test in 01/2021.   Ranae Pila 03/19/2021, 8:27 AM

## 2021-03-20 LAB — SURGICAL PATHOLOGY

## 2021-03-20 NOTE — Discharge Summary (Signed)
Postpartum Discharge Summary  Date of Service updated August 11/20222     Patient Name: Andrea Rangel DOB: Oct 15, 1990 MRN: [id number]  Date of admission: 03/18/2021 Delivery date:03/18/2021  Delivering provider: Tyson Dense  Date of discharge: 03/20/2021  Admitting diagnosis: Pregnancy [Z34.90] Intrauterine pregnancy: [redacted]w[redacted]d    Secondary diagnosis:  Active Problems:   Pregnancy  Additional problems: none    Discharge diagnosis: Term Pregnancy Delivered                                              Post partum procedures:postpartum tubal ligation Augmentation: N/A Complications: None  Hospital course: Sceduled C/S   30y.o. yo G2P2002 at 344w2das admitted to the hospital 03/18/2021 for scheduled cesarean section with the following indication:Elective Repeat.Delivery details are as follows:  Membrane Rupture Time/Date: 7:47 AM ,03/18/2021   Delivery Method:C-Section, Low Transverse  Details of operation can be found in separate operative note.  Patient had an uncomplicated postpartum course.  She is ambulating, tolerating a regular diet, passing flatus, and urinating well. Patient is discharged home in stable condition on  03/20/21        Newborn Data: Birth date:03/18/2021  Birth time:7:48 AM  Gender:Female  Living status:Living  Apgars:10 ,10  Weight:3850 g     Magnesium Sulfate received: No BMZ received: No Rhophylac:No MMR:No T-DaP:Given prenatally Flu: No Transfusion:No  Physical exam  Vitals:   03/19/21 0610 03/19/21 1435 03/19/21 2151 03/20/21 0555  BP: 101/66 121/69 110/65 123/81  Pulse: 78 93 85 85  Resp: _0 Temp: 98.1 F (36.7 C) 98.2 F (36.8 C) 98.1 F (36.7 C) 98.2 F (36.8 C)  TempSrc: Oral Oral Oral Oral  SpO2: 99% 100%  99%  Weight:      Height:       General: alert, cooperative, and no distress Lochia: appropriate Uterine Fundus: firm Incision: Healing well with no significant drainage DVT Evaluation: No evidence of  DVT seen on physical exam. Labs: Lab Results  Component Value Date   WBC 12.0 (H) 03/19/2021   HGB 8.8 (L) 03/19/2021   HCT 27.0 (L) 03/19/2021   MCV 89.4 03/19/2021   PLT 226 03/19/2021   CMP Latest Ref Rng & Units 02/04/2017  Glucose 65 - 99 mg/dL 93  BUN 6 - 20 mg/dL 13  Creatinine 0.44 - 1.00 mg/dL 0.68  Sodium 135 - 145 mmol/L 137  Potassium 3.5 - 5.1 mmol/L 3.7  Chloride 101 - 111 mmol/L 103  CO2 22 - 32 mmol/L 25  Calcium 8.9 - 10.3 mg/dL 9.3  Total Protein 6.0 - 8.3 g/dL -  Total Bilirubin 0.3 - 1.2 mg/dL -  Alkaline Phos 39 - 117 U/L -  AST 0 - 37 U/L -  ALT 0 - 35 U/L -   Edinburgh Score: Edinburgh Postnatal Depression Scale Screening Tool 03/18/2021  I have been able to laugh and see the funny side of things. 0  I have looked forward with enjoyment to things. 0  I have blamed myself unnecessarily when things went wrong. 0  I have been anxious or worried for no good reason. 0  I have felt scared or panicky for no good reason. 0  Things have been getting on top of me. 0  I have been so unhappy that I have had difficulty sleeping. 0  I  have felt sad or miserable. 0  I have been so unhappy that I have been crying. 0  The thought of harming myself has occurred to me. 0  Edinburgh Postnatal Depression Scale Total 0      After visit meds:  Allergies as of 03/20/2021   No Known Allergies      Medication List     TAKE these medications    levothyroxine 137 MCG tablet Commonly known as: SYNTHROID Take 137 mcg by mouth every morning.   PRENATAL GUMMIES PO Take 1 tablet by mouth daily with lunch.         Discharge home in stable condition Infant Feeding: Breast Infant Disposition:home with mother Discharge instruction: per After Visit Summary and Postpartum booklet. Activity: Advance as tolerated. Pelvic rest for 6 weeks.  Diet: routine diet Anticipated Birth Control: Unsure Postpartum Appointment:1 week Additional Postpartum F/U: Incision check 1  week Future Appointments:No future appointments. Follow up Visit:      03/20/2021 Cyril Mourning, MD

## 2021-03-31 ENCOUNTER — Telehealth (HOSPITAL_COMMUNITY): Payer: Self-pay

## 2021-03-31 NOTE — Telephone Encounter (Signed)
No answer. Left a voicemail to return nurse call.   Marcelino Duster [facility] 03/31/2021,1705

## 2021-09-29 ENCOUNTER — Telehealth: Payer: Self-pay | Admitting: "Endocrinology

## 2021-09-29 NOTE — Telephone Encounter (Signed)
Received referral on pt in January, called pt and she said she would check with her new PCP to see if they would be handling her thyroid instead of coming here. I have not heard back and tried to call her today, I did leave a VM.

## 2022-08-21 ENCOUNTER — Other Ambulatory Visit (HOSPITAL_BASED_OUTPATIENT_CLINIC_OR_DEPARTMENT_OTHER): Payer: Self-pay

## 2022-08-21 MED ORDER — LISDEXAMFETAMINE DIMESYLATE 40 MG PO CAPS: 40.0000 mg | ORAL_CAPSULE | Freq: Every day | ORAL | 0 refills | Status: AC

## 2022-08-21 MED ORDER — LISDEXAMFETAMINE DIMESYLATE 40 MG PO CAPS
40.0000 mg | ORAL_CAPSULE | Freq: Every day | ORAL | 0 refills | Status: AC
  Filled 2022-08-21: qty 30, 30d supply, fill #0

## 2022-08-22 ENCOUNTER — Other Ambulatory Visit (HOSPITAL_BASED_OUTPATIENT_CLINIC_OR_DEPARTMENT_OTHER): Payer: Self-pay

## 2022-10-20 ENCOUNTER — Other Ambulatory Visit (HOSPITAL_BASED_OUTPATIENT_CLINIC_OR_DEPARTMENT_OTHER): Payer: Self-pay

## 2022-10-20 MED ORDER — LISDEXAMFETAMINE DIMESYLATE 40 MG PO CAPS
40.0000 mg | ORAL_CAPSULE | Freq: Every morning | ORAL | 0 refills | Status: DC
  Filled 2022-10-20: qty 30, 30d supply, fill #0

## 2022-10-20 MED ORDER — LISDEXAMFETAMINE DIMESYLATE 40 MG PO CAPS: 40.0000 mg | ORAL_CAPSULE | Freq: Every morning | ORAL | 0 refills | Status: AC

## 2022-10-20 MED ORDER — LEVOTHYROXINE SODIUM 112 MCG PO TABS
112.0000 ug | ORAL_TABLET | Freq: Every day | ORAL | 1 refills | Status: DC
  Filled 2022-10-20: qty 90, 90d supply, fill #0
  Filled 2023-02-01: qty 90, 90d supply, fill #1

## 2022-10-20 MED ORDER — LISDEXAMFETAMINE DIMESYLATE 40 MG PO CAPS
40.0000 mg | ORAL_CAPSULE | Freq: Every morning | ORAL | 0 refills | Status: DC
  Filled 2022-11-24 – 2023-01-26 (×2): qty 30, 30d supply, fill #0

## 2022-11-24 ENCOUNTER — Other Ambulatory Visit (HOSPITAL_BASED_OUTPATIENT_CLINIC_OR_DEPARTMENT_OTHER): Payer: Self-pay

## 2022-12-01 ENCOUNTER — Other Ambulatory Visit: Payer: Self-pay

## 2022-12-10 ENCOUNTER — Other Ambulatory Visit: Payer: Self-pay

## 2022-12-22 ENCOUNTER — Other Ambulatory Visit (HOSPITAL_BASED_OUTPATIENT_CLINIC_OR_DEPARTMENT_OTHER): Payer: Self-pay

## 2022-12-22 ENCOUNTER — Encounter (HOSPITAL_BASED_OUTPATIENT_CLINIC_OR_DEPARTMENT_OTHER): Payer: Self-pay

## 2023-01-06 ENCOUNTER — Other Ambulatory Visit (HOSPITAL_BASED_OUTPATIENT_CLINIC_OR_DEPARTMENT_OTHER): Payer: Self-pay

## 2023-01-07 ENCOUNTER — Other Ambulatory Visit: Payer: Self-pay

## 2023-01-07 ENCOUNTER — Other Ambulatory Visit (HOSPITAL_BASED_OUTPATIENT_CLINIC_OR_DEPARTMENT_OTHER): Payer: Self-pay

## 2023-01-26 ENCOUNTER — Other Ambulatory Visit (HOSPITAL_BASED_OUTPATIENT_CLINIC_OR_DEPARTMENT_OTHER): Payer: Self-pay

## 2023-02-01 ENCOUNTER — Other Ambulatory Visit (HOSPITAL_BASED_OUTPATIENT_CLINIC_OR_DEPARTMENT_OTHER): Payer: Self-pay

## 2023-02-03 ENCOUNTER — Other Ambulatory Visit (HOSPITAL_BASED_OUTPATIENT_CLINIC_OR_DEPARTMENT_OTHER): Payer: Self-pay

## 2023-02-05 ENCOUNTER — Other Ambulatory Visit: Payer: Self-pay

## 2023-03-30 ENCOUNTER — Other Ambulatory Visit (HOSPITAL_BASED_OUTPATIENT_CLINIC_OR_DEPARTMENT_OTHER): Payer: Self-pay

## 2023-03-30 MED ORDER — LISDEXAMFETAMINE DIMESYLATE 30 MG PO CAPS
30.0000 mg | ORAL_CAPSULE | Freq: Every morning | ORAL | 0 refills | Status: DC
  Filled 2023-03-30 – 2023-04-17 (×2): qty 30, 30d supply, fill #0

## 2023-03-31 ENCOUNTER — Other Ambulatory Visit (HOSPITAL_BASED_OUTPATIENT_CLINIC_OR_DEPARTMENT_OTHER): Payer: Self-pay

## 2023-04-14 ENCOUNTER — Other Ambulatory Visit (HOSPITAL_BASED_OUTPATIENT_CLINIC_OR_DEPARTMENT_OTHER): Payer: Self-pay

## 2023-04-17 ENCOUNTER — Other Ambulatory Visit (HOSPITAL_BASED_OUTPATIENT_CLINIC_OR_DEPARTMENT_OTHER): Payer: Self-pay

## 2023-05-17 ENCOUNTER — Other Ambulatory Visit (HOSPITAL_BASED_OUTPATIENT_CLINIC_OR_DEPARTMENT_OTHER): Payer: Self-pay

## 2023-05-18 ENCOUNTER — Other Ambulatory Visit (HOSPITAL_BASED_OUTPATIENT_CLINIC_OR_DEPARTMENT_OTHER): Payer: Self-pay

## 2023-05-18 MED ORDER — LEVOTHYROXINE SODIUM 112 MCG PO TABS
112.0000 ug | ORAL_TABLET | Freq: Every day | ORAL | 1 refills | Status: AC
  Filled 2023-05-18: qty 90, 90d supply, fill #0

## 2023-05-19 ENCOUNTER — Other Ambulatory Visit (HOSPITAL_BASED_OUTPATIENT_CLINIC_OR_DEPARTMENT_OTHER): Payer: Self-pay

## 2023-05-19 MED ORDER — LISDEXAMFETAMINE DIMESYLATE 40 MG PO CAPS
40.0000 mg | ORAL_CAPSULE | Freq: Every morning | ORAL | 0 refills | Status: AC
  Filled 2023-05-19: qty 30, 30d supply, fill #0

## 2023-07-06 ENCOUNTER — Other Ambulatory Visit (HOSPITAL_BASED_OUTPATIENT_CLINIC_OR_DEPARTMENT_OTHER): Payer: Self-pay

## 2023-07-12 ENCOUNTER — Other Ambulatory Visit (HOSPITAL_BASED_OUTPATIENT_CLINIC_OR_DEPARTMENT_OTHER): Payer: Self-pay

## 2023-07-12 MED ORDER — LISDEXAMFETAMINE DIMESYLATE 30 MG PO CAPS: 30.0000 mg | ORAL_CAPSULE | Freq: Every morning | ORAL | 0 refills | Status: AC

## 2023-07-12 MED ORDER — LISDEXAMFETAMINE DIMESYLATE 30 MG PO CAPS
30.0000 mg | ORAL_CAPSULE | Freq: Every morning | ORAL | 0 refills | Status: DC
  Filled 2023-07-12: qty 30, 30d supply, fill #0

## 2023-07-12 MED ORDER — LEVOTHYROXINE SODIUM 112 MCG PO TABS
112.0000 ug | ORAL_TABLET | Freq: Every day | ORAL | 1 refills | Status: AC
  Filled 2023-07-12 – 2023-08-15 (×2): qty 90, 90d supply, fill #0

## 2023-07-12 MED ORDER — LISDEXAMFETAMINE DIMESYLATE 30 MG PO CAPS
30.0000 mg | ORAL_CAPSULE | Freq: Every morning | ORAL | 0 refills | Status: AC
  Filled 2023-09-20: qty 30, 30d supply, fill #0

## 2023-07-15 ENCOUNTER — Other Ambulatory Visit (HOSPITAL_BASED_OUTPATIENT_CLINIC_OR_DEPARTMENT_OTHER): Payer: Self-pay

## 2023-08-15 ENCOUNTER — Other Ambulatory Visit (HOSPITAL_BASED_OUTPATIENT_CLINIC_OR_DEPARTMENT_OTHER): Payer: Self-pay

## 2023-08-16 ENCOUNTER — Other Ambulatory Visit: Payer: Self-pay

## 2023-08-16 ENCOUNTER — Other Ambulatory Visit (HOSPITAL_BASED_OUTPATIENT_CLINIC_OR_DEPARTMENT_OTHER): Payer: Self-pay

## 2023-08-16 MED ORDER — LISDEXAMFETAMINE DIMESYLATE 30 MG PO CAPS
30.0000 mg | ORAL_CAPSULE | Freq: Every morning | ORAL | 0 refills | Status: AC
  Filled 2023-08-16: qty 30, 30d supply, fill #0

## 2023-09-17 ENCOUNTER — Other Ambulatory Visit (HOSPITAL_BASED_OUTPATIENT_CLINIC_OR_DEPARTMENT_OTHER): Payer: Self-pay

## 2023-09-20 ENCOUNTER — Other Ambulatory Visit (HOSPITAL_BASED_OUTPATIENT_CLINIC_OR_DEPARTMENT_OTHER): Payer: Self-pay

## 2023-10-26 ENCOUNTER — Other Ambulatory Visit: Payer: Self-pay

## 2023-10-26 ENCOUNTER — Other Ambulatory Visit (HOSPITAL_BASED_OUTPATIENT_CLINIC_OR_DEPARTMENT_OTHER): Payer: Self-pay

## 2023-10-26 MED ORDER — LISDEXAMFETAMINE DIMESYLATE 30 MG PO CAPS: 30.0000 mg | ORAL_CAPSULE | Freq: Every day | ORAL | 0 refills | Status: AC

## 2023-10-26 MED ORDER — LISDEXAMFETAMINE DIMESYLATE 30 MG PO CAPS
30.0000 mg | ORAL_CAPSULE | Freq: Every morning | ORAL | 0 refills | Status: AC
  Filled 2023-10-26: qty 30, 30d supply, fill #0

## 2023-10-26 MED ORDER — LISDEXAMFETAMINE DIMESYLATE 30 MG PO CAPS
30.0000 mg | ORAL_CAPSULE | Freq: Every day | ORAL | 0 refills | Status: AC
  Filled 2023-12-15: qty 30, 30d supply, fill #0
  Filled 2023-12-21: qty 20, 20d supply, fill #0
  Filled 2023-12-21: qty 10, 10d supply, fill #0

## 2023-10-26 MED ORDER — LEVOTHYROXINE SODIUM 112 MCG PO TABS
112.0000 ug | ORAL_TABLET | Freq: Every day | ORAL | 1 refills | Status: AC
  Filled 2023-10-26 – 2024-02-23 (×2): qty 90, 90d supply, fill #0

## 2023-10-29 ENCOUNTER — Other Ambulatory Visit (HOSPITAL_COMMUNITY): Payer: Self-pay

## 2023-10-29 ENCOUNTER — Other Ambulatory Visit (HOSPITAL_BASED_OUTPATIENT_CLINIC_OR_DEPARTMENT_OTHER): Payer: Self-pay

## 2023-10-29 MED ORDER — LEVOTHYROXINE SODIUM 137 MCG PO TABS
137.0000 ug | ORAL_TABLET | Freq: Every day | ORAL | 0 refills | Status: DC
  Filled 2023-10-29: qty 90, 90d supply, fill #0

## 2023-10-30 ENCOUNTER — Other Ambulatory Visit (HOSPITAL_BASED_OUTPATIENT_CLINIC_OR_DEPARTMENT_OTHER): Payer: Self-pay

## 2023-11-02 ENCOUNTER — Other Ambulatory Visit (HOSPITAL_BASED_OUTPATIENT_CLINIC_OR_DEPARTMENT_OTHER): Payer: Self-pay

## 2023-12-12 ENCOUNTER — Other Ambulatory Visit (HOSPITAL_BASED_OUTPATIENT_CLINIC_OR_DEPARTMENT_OTHER): Payer: Self-pay

## 2023-12-15 ENCOUNTER — Other Ambulatory Visit (HOSPITAL_BASED_OUTPATIENT_CLINIC_OR_DEPARTMENT_OTHER): Payer: Self-pay

## 2023-12-15 ENCOUNTER — Other Ambulatory Visit: Payer: Self-pay

## 2023-12-16 ENCOUNTER — Other Ambulatory Visit (HOSPITAL_BASED_OUTPATIENT_CLINIC_OR_DEPARTMENT_OTHER): Payer: Self-pay

## 2023-12-16 ENCOUNTER — Other Ambulatory Visit: Payer: Self-pay

## 2023-12-21 ENCOUNTER — Other Ambulatory Visit (HOSPITAL_BASED_OUTPATIENT_CLINIC_OR_DEPARTMENT_OTHER): Payer: Self-pay

## 2023-12-22 ENCOUNTER — Other Ambulatory Visit (HOSPITAL_BASED_OUTPATIENT_CLINIC_OR_DEPARTMENT_OTHER): Payer: Self-pay

## 2023-12-22 MED ORDER — LISDEXAMFETAMINE DIMESYLATE 30 MG PO CAPS
30.0000 mg | ORAL_CAPSULE | Freq: Every day | ORAL | 0 refills | Status: AC
  Filled 2023-12-22 – 2024-01-24 (×2): qty 30, 30d supply, fill #0

## 2024-01-24 ENCOUNTER — Other Ambulatory Visit: Payer: Self-pay

## 2024-01-24 ENCOUNTER — Other Ambulatory Visit (HOSPITAL_BASED_OUTPATIENT_CLINIC_OR_DEPARTMENT_OTHER): Payer: Self-pay

## 2024-01-26 ENCOUNTER — Other Ambulatory Visit (HOSPITAL_BASED_OUTPATIENT_CLINIC_OR_DEPARTMENT_OTHER): Payer: Self-pay

## 2024-01-26 MED ORDER — LISDEXAMFETAMINE DIMESYLATE 30 MG PO CAPS
30.0000 mg | ORAL_CAPSULE | Freq: Every morning | ORAL | 0 refills | Status: AC
  Filled 2024-01-26: qty 30, 30d supply, fill #0

## 2024-01-26 MED ORDER — LISDEXAMFETAMINE DIMESYLATE 30 MG PO CAPS
30.0000 mg | ORAL_CAPSULE | Freq: Every day | ORAL | 0 refills | Status: AC
  Filled 2024-04-03: qty 30, 30d supply, fill #0

## 2024-01-26 MED ORDER — LISDEXAMFETAMINE DIMESYLATE 30 MG PO CAPS
30.0000 mg | ORAL_CAPSULE | Freq: Every morning | ORAL | 0 refills | Status: AC
  Filled 2024-03-03: qty 30, 30d supply, fill #0

## 2024-02-23 ENCOUNTER — Other Ambulatory Visit (HOSPITAL_BASED_OUTPATIENT_CLINIC_OR_DEPARTMENT_OTHER): Payer: Self-pay

## 2024-02-25 ENCOUNTER — Other Ambulatory Visit: Payer: Self-pay

## 2024-02-25 ENCOUNTER — Other Ambulatory Visit (HOSPITAL_BASED_OUTPATIENT_CLINIC_OR_DEPARTMENT_OTHER): Payer: Self-pay

## 2024-02-25 MED ORDER — LEVOTHYROXINE SODIUM 137 MCG PO TABS
137.0000 ug | ORAL_TABLET | Freq: Every day | ORAL | 0 refills | Status: AC
  Filled 2024-02-25: qty 90, 90d supply, fill #0

## 2024-03-03 ENCOUNTER — Other Ambulatory Visit (HOSPITAL_BASED_OUTPATIENT_CLINIC_OR_DEPARTMENT_OTHER): Payer: Self-pay

## 2024-04-03 ENCOUNTER — Other Ambulatory Visit: Payer: Self-pay

## 2024-04-03 ENCOUNTER — Other Ambulatory Visit (HOSPITAL_BASED_OUTPATIENT_CLINIC_OR_DEPARTMENT_OTHER): Payer: Self-pay

## 2024-04-18 ENCOUNTER — Other Ambulatory Visit (HOSPITAL_BASED_OUTPATIENT_CLINIC_OR_DEPARTMENT_OTHER): Payer: Self-pay

## 2024-04-18 ENCOUNTER — Other Ambulatory Visit (HOSPITAL_COMMUNITY): Payer: Self-pay

## 2024-04-18 MED ORDER — LISDEXAMFETAMINE DIMESYLATE 30 MG PO CAPS: 30.0000 mg | ORAL_CAPSULE | Freq: Every morning | ORAL | 0 refills | Status: AC

## 2024-04-18 MED ORDER — LISDEXAMFETAMINE DIMESYLATE 30 MG PO CAPS: 30.0000 mg | ORAL_CAPSULE | Freq: Every day | ORAL | 0 refills | Status: AC
